# Patient Record
Sex: Female | Born: 1940 | Race: White | Hispanic: No | Smoking: Never smoker
Health system: Southern US, Community
[De-identification: ages and names within clinical notes are randomized; demographics above are authoritative.]

## PROBLEM LIST (undated history)

## (undated) DIAGNOSIS — Z7901 Long term (current) use of anticoagulants: Secondary | ICD-10-CM

## (undated) DIAGNOSIS — M25473 Effusion, unspecified ankle: Secondary | ICD-10-CM

## (undated) DIAGNOSIS — M179 Osteoarthritis of knee, unspecified: Secondary | ICD-10-CM

## (undated) DIAGNOSIS — G4730 Sleep apnea, unspecified: Secondary | ICD-10-CM

## (undated) DIAGNOSIS — H3530 Unspecified macular degeneration: Secondary | ICD-10-CM

## (undated) DIAGNOSIS — M1990 Unspecified osteoarthritis, unspecified site: Secondary | ICD-10-CM

## (undated) DIAGNOSIS — F419 Anxiety disorder, unspecified: Secondary | ICD-10-CM

## (undated) DIAGNOSIS — N201 Calculus of ureter: Secondary | ICD-10-CM

## (undated) DIAGNOSIS — Z87442 Personal history of urinary calculi: Secondary | ICD-10-CM

## (undated) DIAGNOSIS — G4733 Obstructive sleep apnea (adult) (pediatric): Secondary | ICD-10-CM

## (undated) DIAGNOSIS — I10 Essential (primary) hypertension: Secondary | ICD-10-CM

## (undated) DIAGNOSIS — I4891 Unspecified atrial fibrillation: Secondary | ICD-10-CM

## (undated) DIAGNOSIS — M1710 Unilateral primary osteoarthritis, unspecified knee: Secondary | ICD-10-CM

## (undated) DIAGNOSIS — E669 Obesity, unspecified: Secondary | ICD-10-CM

## (undated) HISTORY — DX: Obstructive sleep apnea (adult) (pediatric): G47.33

## (undated) HISTORY — DX: Obesity, unspecified: E66.9

## (undated) HISTORY — DX: Unspecified atrial fibrillation: I48.91

## (undated) HISTORY — PX: CARDIOVERSION: SHX1299

## (undated) HISTORY — DX: Anxiety disorder, unspecified: F41.9

---

## 1997-11-01 ENCOUNTER — Ambulatory Visit (HOSPITAL_COMMUNITY): Admission: RE | Admit: 1997-11-01 | Discharge: 1997-11-01 | Payer: Self-pay | Admitting: Obstetrics and Gynecology

## 1998-12-26 ENCOUNTER — Ambulatory Visit (HOSPITAL_COMMUNITY): Admission: RE | Admit: 1998-12-26 | Discharge: 1998-12-26 | Payer: Self-pay | Admitting: Obstetrics and Gynecology

## 1998-12-26 ENCOUNTER — Encounter: Payer: Self-pay | Admitting: Obstetrics and Gynecology

## 1999-12-28 ENCOUNTER — Encounter: Payer: Self-pay | Admitting: Obstetrics and Gynecology

## 1999-12-28 ENCOUNTER — Ambulatory Visit (HOSPITAL_COMMUNITY): Admission: RE | Admit: 1999-12-28 | Discharge: 1999-12-28 | Payer: Self-pay | Admitting: Obstetrics and Gynecology

## 2000-06-13 ENCOUNTER — Other Ambulatory Visit: Admission: RE | Admit: 2000-06-13 | Discharge: 2000-06-13 | Payer: Self-pay | Admitting: Obstetrics and Gynecology

## 2000-06-13 ENCOUNTER — Encounter (INDEPENDENT_AMBULATORY_CARE_PROVIDER_SITE_OTHER): Payer: Self-pay

## 2000-07-11 ENCOUNTER — Encounter (INDEPENDENT_AMBULATORY_CARE_PROVIDER_SITE_OTHER): Payer: Self-pay | Admitting: Specialist

## 2000-07-11 ENCOUNTER — Ambulatory Visit (HOSPITAL_COMMUNITY): Admission: RE | Admit: 2000-07-11 | Discharge: 2000-07-11 | Payer: Self-pay | Admitting: Obstetrics and Gynecology

## 2000-08-26 ENCOUNTER — Encounter: Payer: Self-pay | Admitting: Obstetrics and Gynecology

## 2000-08-29 ENCOUNTER — Encounter (INDEPENDENT_AMBULATORY_CARE_PROVIDER_SITE_OTHER): Payer: Self-pay | Admitting: Specialist

## 2000-08-29 ENCOUNTER — Ambulatory Visit (HOSPITAL_COMMUNITY): Admission: RE | Admit: 2000-08-29 | Discharge: 2000-08-29 | Payer: Self-pay | Admitting: Obstetrics and Gynecology

## 2000-08-29 HISTORY — PX: HYSTEROSCOPY WITH D & C: SHX1775

## 2000-11-12 ENCOUNTER — Encounter: Payer: Self-pay | Admitting: Obstetrics and Gynecology

## 2000-11-12 ENCOUNTER — Encounter (INDEPENDENT_AMBULATORY_CARE_PROVIDER_SITE_OTHER): Payer: Self-pay | Admitting: Specialist

## 2000-11-12 ENCOUNTER — Inpatient Hospital Stay (HOSPITAL_COMMUNITY): Admission: RE | Admit: 2000-11-12 | Discharge: 2000-11-14 | Payer: Self-pay | Admitting: Obstetrics and Gynecology

## 2000-11-12 HISTORY — PX: ABDOMINAL HYSTERECTOMY: SHX81

## 2001-04-01 ENCOUNTER — Ambulatory Visit (HOSPITAL_COMMUNITY): Admission: RE | Admit: 2001-04-01 | Discharge: 2001-04-01 | Payer: Self-pay | Admitting: Obstetrics and Gynecology

## 2001-04-01 ENCOUNTER — Encounter: Payer: Self-pay | Admitting: Obstetrics and Gynecology

## 2002-06-22 ENCOUNTER — Encounter: Payer: Self-pay | Admitting: Obstetrics and Gynecology

## 2002-06-22 ENCOUNTER — Ambulatory Visit (HOSPITAL_COMMUNITY): Admission: RE | Admit: 2002-06-22 | Discharge: 2002-06-22 | Payer: Self-pay | Admitting: Obstetrics and Gynecology

## 2002-10-16 ENCOUNTER — Emergency Department (HOSPITAL_COMMUNITY): Admission: EM | Admit: 2002-10-16 | Discharge: 2002-10-16 | Payer: Self-pay | Admitting: Emergency Medicine

## 2003-01-04 ENCOUNTER — Ambulatory Visit (HOSPITAL_COMMUNITY): Admission: RE | Admit: 2003-01-04 | Discharge: 2003-01-04 | Payer: Self-pay | Admitting: *Deleted

## 2003-08-18 ENCOUNTER — Ambulatory Visit (HOSPITAL_COMMUNITY): Admission: RE | Admit: 2003-08-18 | Discharge: 2003-08-18 | Payer: Self-pay | Admitting: Obstetrics and Gynecology

## 2004-09-21 ENCOUNTER — Ambulatory Visit (HOSPITAL_COMMUNITY): Admission: RE | Admit: 2004-09-21 | Discharge: 2004-09-21 | Payer: Self-pay | Admitting: Obstetrics and Gynecology

## 2005-10-10 ENCOUNTER — Ambulatory Visit (HOSPITAL_COMMUNITY): Admission: RE | Admit: 2005-10-10 | Discharge: 2005-10-10 | Payer: Self-pay | Admitting: Obstetrics and Gynecology

## 2006-11-04 ENCOUNTER — Ambulatory Visit (HOSPITAL_COMMUNITY): Admission: RE | Admit: 2006-11-04 | Discharge: 2006-11-04 | Payer: Self-pay | Admitting: Obstetrics and Gynecology

## 2007-12-01 ENCOUNTER — Ambulatory Visit (HOSPITAL_COMMUNITY): Admission: RE | Admit: 2007-12-01 | Discharge: 2007-12-01 | Payer: Self-pay | Admitting: Obstetrics and Gynecology

## 2008-11-29 ENCOUNTER — Ambulatory Visit (HOSPITAL_COMMUNITY): Admission: RE | Admit: 2008-11-29 | Discharge: 2008-11-29 | Payer: Self-pay | Admitting: Cardiology

## 2008-12-27 ENCOUNTER — Inpatient Hospital Stay (HOSPITAL_COMMUNITY): Admission: AD | Admit: 2008-12-27 | Discharge: 2008-12-29 | Payer: Self-pay | Admitting: Cardiology

## 2009-02-23 ENCOUNTER — Ambulatory Visit (HOSPITAL_COMMUNITY): Admission: RE | Admit: 2009-02-23 | Discharge: 2009-02-23 | Payer: Self-pay | Admitting: Obstetrics and Gynecology

## 2010-02-20 ENCOUNTER — Ambulatory Visit (HOSPITAL_COMMUNITY): Admission: RE | Admit: 2010-02-20 | Discharge: 2010-02-20 | Payer: Self-pay | Admitting: Cardiology

## 2010-03-15 ENCOUNTER — Ambulatory Visit (HOSPITAL_COMMUNITY): Admission: RE | Admit: 2010-03-15 | Discharge: 2010-03-15 | Payer: Self-pay | Admitting: Obstetrics and Gynecology

## 2010-04-17 ENCOUNTER — Encounter
Admission: RE | Admit: 2010-04-17 | Discharge: 2010-05-12 | Payer: Self-pay | Source: Home / Self Care | Attending: Specialist | Admitting: Specialist

## 2010-07-11 HISTORY — PX: DILATION AND CURETTAGE OF UTERUS: SHX78

## 2010-09-10 LAB — COMPREHENSIVE METABOLIC PANEL
ALT: 23 U/L (ref 0–35)
BUN: 18 mg/dL (ref 6–23)
CO2: 31 mEq/L (ref 19–32)
Calcium: 9.7 mg/dL (ref 8.4–10.5)
Chloride: 105 mEq/L (ref 96–112)
GFR calc Af Amer: >60 mL/min (ref >60)
GFR calc non Af Amer: >60 mL/min (ref >60)
Glucose, Bld: 110 mg/dL — ABNORMAL HIGH (ref 70–99)
Potassium: 4.6 mEq/L (ref 3.5–5.1)

## 2010-09-10 LAB — PROTIME-INR
INR: 2.1 — ABNORMAL HIGH (ref 0.00–1.49)
INR: 2.2 — ABNORMAL HIGH (ref 0.00–1.49)
Prothrombin Time: 24.9 seconds — ABNORMAL HIGH (ref 11.6–15.2)
Prothrombin Time: 26.6 seconds — ABNORMAL HIGH (ref 11.6–15.2)

## 2010-09-10 LAB — TSH: TSH: 1.02 u[IU]/mL (ref 0.350–4.500)

## 2010-09-10 LAB — CBC
MCHC: 34.6 g/dL (ref 30.0–36.0)
Platelets: 219 10*3/uL (ref 150–400)

## 2010-09-10 LAB — APTT: aPTT: 38 seconds — ABNORMAL HIGH (ref 24–37)

## 2010-10-17 NOTE — Discharge Summary (Signed)
[name], [name] NO.:  [id number]   MEDICAL RECORD NO.:  [id number]          PATIENT TYPE:  OIB   LOCATION:  2899                         FACILITY:  [facility]   PHYSICIAN:  Armanda Magic, M.D.     DATE OF BIRTH:  11-11-1940   DATE OF ADMISSION:  11/29/2008  DATE OF DISCHARGE:  11/29/2008                               DISCHARGE SUMMARY   ADMISSION DIAGNOSES:  1. Atrial fibrillation.  2. Systemic anticoagulation.  3. Hypertension.  4. Anxiety.  5. Osteoarthritis.   DISCHARGE DIAGNOSES:  1. Atrial fibrillation, status post amiodarone loading.  2. Hypertension.  3. Systemic anticoagulation.  4. Anxiety.  5. Osteoarthritis.   PROCEDURES:  None.   HISTORY OF PRESENT ILLNESS:  This is a pleasant 70 year old female who  has been in atrial fibrillation and underwent cardioversion on November 29, 2008, and has reverted back to atrial fibrillation.  She has been  therapeutic on her Coumadin for more than 4 weeks and is now admitted  for loading with amiodarone.   HOSPITAL COURSE:  The patient was admitted to the hospital and placed on  amiodarone 400 mg b.i.d.  She did undergo pulmonary function testing,  which was completely normal.  Her TSH, LFTs, CBC, and basic metabolic  panel were all within normal limits.  She maintained a therapeutic INR  throughout her hospital stay.  Chest x-ray revealed some mild chronic  bronchitic changes.  Her hospital course was uncomplicated.  Her QTc  remained within normal range for amiodarone loading, and her QTc on day  of discharge was 471 milliseconds, and EKG showed persistent atrial  fibrillation with controlled ventricular response.  She was subsequently  discharged to home on the 28th in stable condition.   LABORATORY DATA DURING HOSPITAL STAY:  INR was 2.2 on the 28th, TSH  1.020, sodium 141, potassium 4.6, chloride 105, CO2 31, glucose 110, BUN  18, and creatinine 0.66.  white cell count 6, hemoglobin 14.5,  hematocrit  41.8, and platelet count 219.   DISCHARGE MEDICATIONS:  1. Vitamin D 1000 international units daily.  2. Furosemide 40 mg daily.  3. Metformin HCL ER 500 mg daily.  4. Potassium chloride 20 mEq 1 tablet daily.  5. Celebrex 200 mg p.r.n.  6. Acetaminophen p.r.n.  7. ICaps multivitamin daily.  8. Fish oil 1000 mg 2 tablets twice a day.  9. Vitamin B complex daily.  10.Hyzaar 50/12.5 mg daily.  11.Metoprolol tartrate 50 mg b.i.d.  12.Warfarin 7.5 everyday except for 5 mg on Wednesday and Saturday.   OUTPATIENT FOLLOWUP:  The patient will follow up in our Coumadin Clinic  with Alfonse Ras, our pharmacologist, on December 31, 2008, at her  regularly scheduled appointment.  She will also follow up with me in the  office on January 26, 2009, at 10:00 a.m. for followup EKG and evaluation  to see if she is in atrial fibrillation.  If she has not converted on  her own to sinus rhythm, we will plan outpatient cardioversion at that  time.  She will also be following up in our clinic  in 1 week for an EKG  check.      Armanda Magic, M.D.  Electronically Signed     TT/MEDQ  D:  12/29/2008  T:  12/29/2008  Job:  [id number]   cc:   Molly Maduro L. Foy Guadalajara, M.D.

## 2010-10-17 NOTE — Op Note (Signed)
[name], [name] NO.:  [id number]   MEDICAL RECORD NO.:  [id number]          PATIENT TYPE:  OIB   LOCATION:  2899                         FACILITY:  [facility]   PHYSICIAN:  [name], M.D.  DATE OF BIRTH:  04/17/1941   DATE OF PROCEDURE:  11/29/2008  DATE OF DISCHARGE:  11/29/2008                               OPERATIVE REPORT   REFERRING PHYSICIAN:  Molly Maduro L. Foy Guadalajara, MD   PROCEDURE:  Direct current cardioversion.   OPERATOR:  Shelia Magic, MD   INDICATIONS:  Atrial fibrillation.   COMPLICATIONS:  None.   INTRAVENOUS MEDICATIONS:  Pentothal 200 mg IV.   This is a very pleasant 70 year old female who has a history of atrial  fibrillation in the past and presented with recurrent atrial  fibrillation.  She has been adequately anticoagulated with an INR of  greater than 2 for 4 weeks and now presents for cardioversion.   The patient was brought to the day hospital in a fasting and nonsedated  state.  Informed consent was obtained.  The patient was connected to  continuous heart rate and pulse oximetry monitoring and intermittent  blood pressure monitoring.  The defibrillator pads were placed on the  left anterior chest and back.  After adequate anesthesia was obtained, a  150 joules synchronized biphasic shock was delivered which successfully  converted the patient to sinus rhythm.  The patient tolerated the  procedure well without complications.  The patient subsequently was  discharged to home after fully awake and ambulating.   ASSESSMENT:  1. Atrial fibrillation.  2. Systemic anticoagulation with therapeutic INR.  3. Status post successful cardioversion to sinus rhythm.   PLAN:  Discharge home when fully awake and ambulating and follow up with  me in 2 weeks.      [name], M.D.  Electronically Signed     ______________________________  [name], M.D.    TT/MEDQ  D:  11/29/2008  T:  11/30/2008  Job:  [id number]   cc:    [name], M.D.

## 2010-10-17 NOTE — Discharge Summary (Signed)
NAMEKIRSTEIN, BAXLEY NO.:  [id number]   MEDICAL RECORD NO.:  [id number]          PATIENT TYPE:  INP   LOCATION:  2007                         FACILITY:  [facility]   PHYSICIAN:  Armanda Magic, M.D.     DATE OF BIRTH:  08-21-1940   DATE OF ADMISSION:  12/27/2008  DATE OF DISCHARGE:  12/29/2008                               DISCHARGE SUMMARY   ADDENDUM   The patient is also discharged home on amiodarone 200 mg.  She will take  2 tablets twice daily for 2 weeks, then 2 tablets once daily for 1 week,  and then 1 tablet daily.      Armanda Magic, M.D.  Electronically Signed     Armanda Magic, M.D.  Electronically Signed    TT/MEDQ  D:  12/29/2008  T:  12/29/2008  Job:  [id number]

## 2010-10-20 NOTE — Consult Note (Signed)
[facility]  Patient:    [name], [name]                        MRN: [id number] Adm. Date:  [id number] Attending:  Lendon Colonel CC:         Dr. Foy Guadalajara.  Kyra Manges, M.D.   Consultation Report  CARDIOLOGY CONSULT  CHIEF COMPLAINT:              Supraventricular tachycardia.  HISTORY OF PRESENT ILLNESS:   This is 70 year old with no previous history but a history of hypertension, no cardiac symptoms, who was admitted for a D&C and uterine fibroid resection. During her surgery, she had an episode of SVT wit heart rates up to 140-150 beats per minute with a blood pressure of 145/75. SVT was terminated after a total of 18 mg worth of Adenocard in the recovery room. Currently, she is asymptomatic. She has no history of palpitations.  PAST MEDICAL HISTORY:         Significant for hypertension.  PAST SURGICAL HISTORY:        None.  ALLERGIES:                    PENICILLIN.  CURRENT MEDICATIONS:          Activella, BuSpar and Tenoretic.  SOCIAL HISTORY:               She is married with two daughters. N tobacco use, alcohol use.  FAMILY HISTORY:               Father is 52 with colon cancer, diabetes. He is status post myocardial infarction in his 21s. Her mother is 37 years old with Alzheimers, hypertension, and questionable heart disease in past. She has one brother who died at 1 or aortic dissection. She has two brothers and three sisters who are healthy with no coronary disease.  PHYSICAL EXAMINATION:  VITAL SIGNS:                  Blood pressure is 140/65. Heart rate is 60-70.  GENERAL:                      This is a well-developed, obese white female in no acute distress.  HEENT:                        Pupils are equal, round, reactive to light and accommodation. Extraocular muscles intact bilaterally.  NECK:                         Supple without lymphadenopathy. No bruits.  LUNGS:                        Clear to auscultation  throughout.  HEART:                        Regular rate and rhythm.  No murmurs, rubs, or gallops. Normal S1, S2.  ABDOMEN:                      Soft, nontender, nondistended with active bowel sounds. No hepatosplenomegaly.  EXTREMITIES:                  No cyanosis, erythema or edema. Good distal pulses.  LABORATORIES:  Potassium 2.9 on February 5. CPK 133. MB 3.5. Troponin 0.01.  EKG shows normal sinus rhythm with nonspecific ST-T wave abnormalities.  ASSESSMENT:                   Supraventricular tachycardia terminated with                               Adenocard. There is no history of coronary                               disease or chest pain but she does have several                               cardiac risk factors including obesity,                               family history of coronary disease at an early                               age, history of hyperlipidemia with a total                               cholesterol recently of 234.  RECOMMENDATIONS:              1. Recheck potassium. If still less than 3.5,                                 replete before discharge.                               2. Cardizem CD 180 mg a day.                               3. Outpatient Adenosine Cardiolite in our                                  office.                               4. Follow up with Dr. Mayford Knife in one week. DD:  07/11/00 TD:  07/11/00 Job: [id number]

## 2010-10-20 NOTE — Op Note (Signed)
[facility]  Patient:    [name], [name]                        MRN: [id number] Proc. Date: 08/29/00 Adm. Date:  [id number] Attending:  Lendon Colonel                           Operative Report  PREOPERATIVE DIAGNOSES:  Persistent postmenopausal vaginal bleeding, uterine fibroids, and multiple uterine polyps.  POSTOPERATIVE DIAGNOSES:  Persistent postmenopausal vaginal bleeding, uterine fibroids, and multiple uterine polyps.  OPERATION PERFORMED:  Hysteroscopy with resection of multiple polyps and myomas.  DESCRIPTION OF PROCEDURE:  The patient was placed in lithotomy position, prepped and draped in the usual fashion. The bladder emptied, cervix progressively dilated. The cervix was injected with 20 cc of a Pitressin solution containing 10 units of Pitressin and 250 cc of saline and then the hysteroscopy was accomplished. There was a large posterior uterine myoma which was resected and multiple anterior uterine polyps that were resected. The surface of the endometrial cavity was then cauterized to reduce blood loss. All of the resected material was sent to the lab for study. The uterus was quite large. No unusual blood loss occurred. Her cardiac rate and rhythm were perfectly acceptable during the procedure. She was taken to the recovery room in good condition. DD:  08/29/00 TD:  08/29/00 Job: [id number]

## 2010-10-20 NOTE — H&P (Signed)
[facility]  Patient:    [name], [name]                        MRN: [id number] Adm. Date:  [id number] Disc. Date: [id number] Attending:  Lendon Colonel                         History and Physical  CHIEF COMPLAINT:  Continued abnormal uterine bleeding.  HISTORY OF PRESENT ILLNESS:  [age]-year-old para 2 female who is postmenopausal, on hormone replacement therapy, with abnormal uterine bleeding.  She has continued to spot despite Lo/Ovral therapy and numerous other estrogen preparations.  At the time of hysteroscopy, she had a large uterine fibroid, multiple polyps and myomas.  Because of the continued bleeding, a hysterectomy is recommended.  PAST MEDICAL HISTORY:  Significant in that she is hypertensive.  MEDICATIONS:  She is on Cardizem and Lo/Ovral.  ALLERGIES:  She is allergic to PENICILLIN.  PAST SURGICAL HISTORY:  She has had two hysteroscopies.  With her first hysteroscopy, she had SVT.  She was cleared by the cardiologist, Dr. Armanda Magic, after finding a normal stress test.  REVIEW OF SYSTEMS:  HEENT:  She wears glasses but no decrease in visual or auditory acuity.  HEART:  She has had recent cardiac workup, on Cardizem for the SVT, but denies chest pain, denies shortness of breath.  Her hypertension is well-controlled with atenolol and chlorothiazide.  LUNGS:  No history of cough.  No history of asthma.  GU:  No stress incontinence of frequency.  No history of UTI.  GI:  No bowel habit change.  No melena.  No history of ulcers.  MUSCLE, BONES AND JOINTS:  She has a history of arthritis for which she takes occasional ibuprofen.  SOCIAL HISTORY:  She works for [employer].  Does not smoke or drink.  FAMILY HISTORY:  Her mother is 31, has hypertension and Alzheimers.  Father is 72 and has cancer of the colon and heart disease.  She has had three myocardial infarctions and is diabetic.  PHYSICAL EXAMINATION:  VITAL  SIGNS:  Examination reveals a weight of 224 pounds, a blood pressure of 140/80.  HEENT:   Examination of the eyes, ears, nose and throat is unremarkable. Oropharynx is not injected.  The pupils are round and regular and react to light and accommodation.  NECK:  Supple.  Thyroid is not enlarged.  Carotid pulses are equal without bruits.  No adenopathy appreciated.  Trachea is in the midline.  BREASTS:  No masses or tenderness.  HEART:  Normal sinus rhythm.  No murmurs.  LUNGS:  Clear.  ABDOMEN:  Soft, obese.  Liver, spleen and kidneys are not palpated.  Bowel sounds are normal.  No bruits are heard.  PELVIC:  Vulva and vagina are normal.  The cervix is clean.  Uterus is enlarged to about three times normal size with no adnexal masses noted.  RECTAL:  Rectovaginal confirms.  Hemoccult is negative.  EXTREMITIES:  Good range of motion, equal pulses and reflexes.  NEUROLOGIC:  Patient is alert, oriented to time, place and recent events and cranial nerves are intact.  IMPRESSION:  Large uterine fibroids with continued postmenopausal bleeding.  PLAN:  TAH/BSO, risks and benefits discussed with patient. DD:  11/12/00 TD:  11/12/00 Job: [id number]

## 2010-10-20 NOTE — Discharge Summary (Signed)
[facility]  Patient:    [name], [name]                     MRN: [id number] Adm. Date:  [id number] Disc. Date: [id number] Attending:  Lendon Colonel CC:         Armanda Magic, M.D.   Discharge Summary  ADMISSION DIAGNOSES: 1. Continued postmenopausal bleeding. 2. Uterine fibroids.  DISCHARGE DIAGNOSES: 1. Continued postmenopausal bleeding. 2. Uterine fibroids.  OPERATIONS:  Total abdominal hysterectomy and bilateral salpingo-oophorectomy.  HISTORY OF PRESENT ILLNESS:  [name] is a 70 year old female, status post hysteroscopy for resection of multiple fibroids, who continued to have heavy bleeding and was admitted for surgery.  PAST MEDICAL HISTORY:  ASVT which was treated successfully with Cardizem. Cardiac evaluation by Dr. Mayford Knife has been within normal limits.  LABORATORY DATA:  Hemoglobin 13, mean corpuscular volume was 88.  Metabolic profile was normal.  Chest x-ray and abdomen x-ray were negative.  HOSPITAL COURSE:  The patient was admitted to the hospital, underwent an uneventful total abdominal hysterectomy and bilateral salpingo-oophorectomy with the findings of a large cervical fibroid, which was benign.  Both ovaries were benign.  Her postoperative course was uncomplicated.  She remained afebrile and without complaints.  She had no cardiac arrhythmias, and was discharged with her preoperative medications, including hormone replacement therapy.  She will call for fever or bleeding, and come to the office in two weeks.  CONDITION ON DISCHARGE:  Improved. DD:  11/20/00 TD:  11/20/00 Job: [id number]

## 2010-10-20 NOTE — Op Note (Signed)
[facility]  Patient:    [name], [name]                        MRN: [id number] Proc. Date: 11/12/00 Adm. Date:  [id number] Disc. Date: [id number] Attending:  Lendon Colonel                           Operative Report  PREOPERATIVE DIAGNOSES:  Large uterine fibroids, persistent postmenopausal bleeding.  POSTOPERATIVE DIAGNOSES:  Large uterine fibroids, persistent postmenopausal bleeding.  OPERATION:  Total abdominal hysterectomy, bilateral salpingo-oophorectomy, excision of large cervical fibroid.  DESCRIPTION OF PROCEDURE:  The patient was placed in lithotomy position and prepped and draped in the usual fashion.  The abdomen was entered through a transverse approach.  Exploration of the upper abdomen revealed a normal liver, gallbladder.  The gallbladder appeared to have a little sand in it. Both kidneys were normal.  There was no free peritoneal fluid, and I felt no intraabdominal masses.  There was a marked amount of fatty tissue in the omentum.  The abdominal viscera were then carefully packed away from the pelvic viscera.  The uterus was enlarged.  Both ovaries appeared to be normal.  The infundibulopelvic ligaments were skeletonized and ligated with 0 chromic sutures x 2.  On each side, the round ligaments were severed.  Uterine vessels were then skeletonized.  At that time, it was noticed that there was about a 3 cm large cervical fibroid and using careful bites down the side of the cervix into the paracervical tissue, we were able to complete the hysterectomy, removing the uterus, ovaries, and cervix intact.  The angles of the vagina were transfixed with a horizontal mattress suture of 0 chromic and then the vagina was closed with figure-of-eight sutures of 2-0 Vicryl.  The uterosacral ligaments were plicated in the midline quite carefully to effect good vault support, and the peritoneum was closed with 3-0 Vicryl.  Hemostasis was secure.   The parietal peritoneum was closed with 2-0 PDS.  The fascia was closed with 2-0 PDS and interrupted sutures of 0 Vicryl.  Following this, I was able to put one suture of 3-0 Vicryl in the subcutaneum and close the skin with 4-0 PDS subcuticular suture.  Dry, sterile dressing was applied.  Then 20 cc of 0.5% Marcaine with epinephrine was injected into the incision.  Ms. Barna tolerated this procedure well and was sent to the recovery room in good condition. DD:  11/12/00 TD:  11/12/00 Job: [id number]

## 2010-10-20 NOTE — Op Note (Signed)
[facility]  Patient:    [name], [name]                        MRN: [id number] Proc. Date: 07/11/00 Adm. Date:  [id number] Disc. Date: [id number] Attending:  Lendon Colonel                           Operative Report  PREOPERATIVE DIAGNOSES:       1. Postmenopausal vaginal bleeding.                               2. Uterine fibroids.  POSTOPERATIVE DIAGNOSES:      1. Postmenopausal vaginal bleeding.                               2. Uterine fibroids.  OPERATION:                    Dilation and curettage, hysteroscopy abandoned because of tachycardia.  DESCRIPTION OF PROCEDURE:     The patient was placed in lithotomy position and prepped and draped in the usual fashion.  The cervix dilated.  While I was dilating the cervix, tachycardia occurred, and it did not respond to measures by the anesthesiologist, so a rapid curettage was performed with removal of endometrial tissue, and the procedure was abandoned to take the patient to the recovery room. DD:  07/11/00 TD:  07/13/00 Job: [id number]

## 2011-03-13 ENCOUNTER — Ambulatory Visit (HOSPITAL_COMMUNITY)
Admission: RE | Admit: 2011-03-13 | Discharge: 2011-03-13 | Disposition: A | Discharge status: Home / Self Care | Payer: Medicare Other | Source: Ambulatory Visit | Attending: Cardiology | Admitting: Cardiology

## 2011-03-13 DIAGNOSIS — I4891 Unspecified atrial fibrillation: Secondary | ICD-10-CM | POA: Insufficient documentation

## 2011-03-13 DIAGNOSIS — Z79899 Other long term (current) drug therapy: Secondary | ICD-10-CM | POA: Insufficient documentation

## 2011-03-28 ENCOUNTER — Other Ambulatory Visit (HOSPITAL_COMMUNITY): Payer: Self-pay | Admitting: Urology

## 2011-03-28 DIAGNOSIS — N281 Cyst of kidney, acquired: Secondary | ICD-10-CM

## 2011-03-30 ENCOUNTER — Ambulatory Visit (HOSPITAL_COMMUNITY)
Admission: RE | Admit: 2011-03-30 | Discharge: 2011-03-30 | Disposition: A | Discharge status: Home / Self Care | Payer: Medicare Other | Source: Ambulatory Visit | Attending: Urology | Admitting: Urology

## 2011-03-30 DIAGNOSIS — K8020 Calculus of gallbladder without cholecystitis without obstruction: Secondary | ICD-10-CM | POA: Insufficient documentation

## 2011-03-30 DIAGNOSIS — Q619 Cystic kidney disease, unspecified: Secondary | ICD-10-CM | POA: Insufficient documentation

## 2011-03-30 DIAGNOSIS — K7689 Other specified diseases of liver: Secondary | ICD-10-CM | POA: Insufficient documentation

## 2011-03-30 DIAGNOSIS — N281 Cyst of kidney, acquired: Secondary | ICD-10-CM

## 2011-03-30 MED ORDER — GADOBENATE DIMEGLUMINE 529 MG/ML IV SOLN
20.0000 mL | Freq: Once | INTRAVENOUS | Status: AC | PRN
  Administered 2011-03-30: 20 mL via INTRAVENOUS

## 2011-04-16 ENCOUNTER — Other Ambulatory Visit: Payer: Self-pay | Admitting: Urology

## 2011-05-02 ENCOUNTER — Other Ambulatory Visit (HOSPITAL_COMMUNITY): Payer: Self-pay | Admitting: Family Medicine

## 2011-05-02 ENCOUNTER — Encounter (HOSPITAL_BASED_OUTPATIENT_CLINIC_OR_DEPARTMENT_OTHER): Payer: Self-pay | Admitting: *Deleted

## 2011-05-02 DIAGNOSIS — Z1231 Encounter for screening mammogram for malignant neoplasm of breast: Secondary | ICD-10-CM

## 2011-05-02 NOTE — Progress Notes (Signed)
NPO AFTER MN. PT ARRIVES AT 0945. NEEDS PT/INR, BMET, HG, KUB, AND EKG. WILL TAKE METOPROLOL AND AMIODORONE AM OF SURG. W/ SIP OF WATER. LAST NOTE, EKG, ECHO AND STRESS TEST TO BE FAXED FROM DR TRACI TURNER.

## 2011-05-04 ENCOUNTER — Ambulatory Visit (HOSPITAL_BASED_OUTPATIENT_CLINIC_OR_DEPARTMENT_OTHER)
Admission: RE | Admit: 2011-05-04 | Discharge: 2011-05-04 | Disposition: A | Discharge status: Home / Self Care | Payer: Medicare Other | Source: Ambulatory Visit | Attending: Urology | Admitting: Urology

## 2011-05-04 ENCOUNTER — Encounter (HOSPITAL_BASED_OUTPATIENT_CLINIC_OR_DEPARTMENT_OTHER)
Admission: RE | Disposition: A | Discharge status: Home / Self Care | Payer: Self-pay | Source: Ambulatory Visit | Attending: Urology

## 2011-05-04 ENCOUNTER — Encounter (HOSPITAL_BASED_OUTPATIENT_CLINIC_OR_DEPARTMENT_OTHER): Payer: Self-pay | Admitting: Anesthesiology

## 2011-05-04 ENCOUNTER — Other Ambulatory Visit: Payer: Self-pay

## 2011-05-04 ENCOUNTER — Ambulatory Visit (HOSPITAL_COMMUNITY): Payer: Medicare Other

## 2011-05-04 ENCOUNTER — Ambulatory Visit (HOSPITAL_BASED_OUTPATIENT_CLINIC_OR_DEPARTMENT_OTHER): Payer: Medicare Other | Admitting: Anesthesiology

## 2011-05-04 ENCOUNTER — Encounter (HOSPITAL_BASED_OUTPATIENT_CLINIC_OR_DEPARTMENT_OTHER): Payer: Self-pay | Admitting: *Deleted

## 2011-05-04 DIAGNOSIS — R3129 Other microscopic hematuria: Secondary | ICD-10-CM | POA: Insufficient documentation

## 2011-05-04 DIAGNOSIS — E119 Type 2 diabetes mellitus without complications: Secondary | ICD-10-CM | POA: Insufficient documentation

## 2011-05-04 DIAGNOSIS — Z79899 Other long term (current) drug therapy: Secondary | ICD-10-CM | POA: Insufficient documentation

## 2011-05-04 DIAGNOSIS — Z7901 Long term (current) use of anticoagulants: Secondary | ICD-10-CM | POA: Insufficient documentation

## 2011-05-04 DIAGNOSIS — I4891 Unspecified atrial fibrillation: Secondary | ICD-10-CM | POA: Insufficient documentation

## 2011-05-04 DIAGNOSIS — N201 Calculus of ureter: Secondary | ICD-10-CM | POA: Insufficient documentation

## 2011-05-04 DIAGNOSIS — N35919 Unspecified urethral stricture, male, unspecified site: Secondary | ICD-10-CM | POA: Insufficient documentation

## 2011-05-04 DIAGNOSIS — I10 Essential (primary) hypertension: Secondary | ICD-10-CM | POA: Insufficient documentation

## 2011-05-04 DIAGNOSIS — N1330 Unspecified hydronephrosis: Secondary | ICD-10-CM | POA: Insufficient documentation

## 2011-05-04 HISTORY — DX: Unilateral primary osteoarthritis, unspecified knee: M17.10

## 2011-05-04 HISTORY — DX: Calculus of ureter: N20.1

## 2011-05-04 HISTORY — DX: Unspecified osteoarthritis, unspecified site: M19.90

## 2011-05-04 HISTORY — DX: Effusion, unspecified ankle: M25.473

## 2011-05-04 HISTORY — DX: Essential (primary) hypertension: I10

## 2011-05-04 HISTORY — DX: Unspecified macular degeneration: H35.30

## 2011-05-04 HISTORY — DX: Osteoarthritis of knee, unspecified: M17.9

## 2011-05-04 HISTORY — PX: CYSTOSCOPY/RETROGRADE/URETEROSCOPY: SHX5316

## 2011-05-04 HISTORY — DX: Long term (current) use of anticoagulants: Z79.01

## 2011-05-04 HISTORY — PX: CYSTOSCOPY WITH URETHRAL DILATATION: SHX5125

## 2011-05-04 LAB — PROTIME-INR
INR: 1.12 (ref 0.00–1.49)
Prothrombin Time: 14.6 seconds (ref 11.6–15.2)

## 2011-05-04 LAB — BASIC METABOLIC PANEL
Chloride: 98 mEq/L (ref 96–112)
Creatinine, Ser: 0.63 mg/dL (ref 0.50–1.10)
GFR calc Af Amer: >90 mL/min (ref >90)
Potassium: 3.7 mEq/L (ref 3.5–5.1)
Sodium: 135 mEq/L (ref 135–145)

## 2011-05-04 LAB — HEMOGLOBIN AND HEMATOCRIT, BLOOD: Hemoglobin: 13 g/dL (ref 12.0–15.0)

## 2011-05-04 SURGERY — CYSTOSCOPY/RETROGRADE/URETEROSCOPY
Anesthesia: General | Site: Urethra | Laterality: Left | Wound class: Clean Contaminated

## 2011-05-04 MED ORDER — HYDROCODONE-ACETAMINOPHEN 5-500 MG PO TABS: 1.0000 | ORAL_TABLET | Freq: Four times a day (QID) | ORAL | Status: AC | PRN

## 2011-05-04 MED ORDER — IOHEXOL 350 MG/ML SOLN
INTRAVENOUS | Status: DC | PRN
  Administered 2011-05-04: 50 mL

## 2011-05-04 MED ORDER — KETOROLAC TROMETHAMINE 30 MG/ML IJ SOLN: 15.0000 mg | Freq: Once | INTRAMUSCULAR | Status: DC | PRN

## 2011-05-04 MED ORDER — SODIUM CHLORIDE 0.9 % IR SOLN
Status: DC | PRN
  Administered 2011-05-04: 3000 mL

## 2011-05-04 MED ORDER — FENTANYL CITRATE 0.05 MG/ML IJ SOLN: 25.0000 ug | INTRAMUSCULAR | Status: DC | PRN

## 2011-05-04 MED ORDER — LACTATED RINGERS IV SOLN
INTRAVENOUS | Status: DC
  Administered 2011-05-04 (×2): via INTRAVENOUS

## 2011-05-04 MED ORDER — LIDOCAINE HCL (CARDIAC) 20 MG/ML IV SOLN
INTRAVENOUS | Status: DC | PRN
  Administered 2011-05-04: 80 mg via INTRAVENOUS
  Administered 2011-05-04: 20 mg via INTRAVENOUS

## 2011-05-04 MED ORDER — ONDANSETRON HCL 4 MG/2ML IJ SOLN
INTRAMUSCULAR | Status: DC | PRN
  Administered 2011-05-04: 4 mg via INTRAVENOUS

## 2011-05-04 MED ORDER — PROPOFOL 10 MG/ML IV EMUL
INTRAVENOUS | Status: DC | PRN
  Administered 2011-05-04: 50 mg via INTRAVENOUS
  Administered 2011-05-04: 200 mg via INTRAVENOUS

## 2011-05-04 MED ORDER — CIPROFLOXACIN IN D5W 400 MG/200ML IV SOLN
400.0000 mg | Freq: Two times a day (BID) | INTRAVENOUS | Status: DC
  Administered 2011-05-04: 400 mg via INTRAVENOUS

## 2011-05-04 MED ORDER — FENTANYL CITRATE 0.05 MG/ML IJ SOLN
INTRAMUSCULAR | Status: DC | PRN
  Administered 2011-05-04: 50 ug via INTRAVENOUS
  Administered 2011-05-04 (×2): 25 ug via INTRAVENOUS

## 2011-05-04 MED ORDER — CEFAZOLIN SODIUM 1-5 GM-% IV SOLN: 1.0000 g | INTRAVENOUS | Status: DC

## 2011-05-04 SURGICAL SUPPLY — 21 items
ADAPTER CATH URET PLST 4-6FR (CATHETERS) ×3 IMPLANT
BAG DRAIN URO-CYSTO SKYTR STRL (DRAIN) ×3 IMPLANT
BASKET ZERO TIP NITINOL 2.4FR (BASKET) ×3 IMPLANT
CANISTER SUCT LVC 12 LTR MEDI- (MISCELLANEOUS) ×3 IMPLANT
CATH INTERMIT  6FR 70CM (CATHETERS) ×3 IMPLANT
CATH URET 5FR 28IN CONE TIP (BALLOONS) ×1
CATH URET 5FR 70CM CONE TIP (BALLOONS) ×2 IMPLANT
CLOTH BEACON ORANGE TIMEOUT ST (SAFETY) ×3 IMPLANT
DRAPE CAMERA CLOSED 9X96 (DRAPES) ×3 IMPLANT
GLOVE BIO SURGEON STRL SZ 6.5 (GLOVE) ×3 IMPLANT
GLOVE BIO SURGEON STRL SZ7 (GLOVE) ×3 IMPLANT
GLOVE ECLIPSE 6.0 STRL STRAW (GLOVE) ×3 IMPLANT
GOWN PREVENTION PLUS LG XLONG (DISPOSABLE) ×6 IMPLANT
GOWN XL W/COTTON TOWEL STD (GOWNS) ×6 IMPLANT
GUIDEWIRE ANG ZIPWIRE 038X150 (WIRE) ×3 IMPLANT
GUIDEWIRE STR DUAL SENSOR (WIRE) ×3 IMPLANT
IV NS IRRIG 3000ML ARTHROMATIC (IV SOLUTION) ×6 IMPLANT
LASER FIBER DISP (UROLOGICAL SUPPLIES) ×3 IMPLANT
PACK CYSTOSCOPY (CUSTOM PROCEDURE TRAY) ×3 IMPLANT
STENT URET 6FRX24 CONTOUR (STENTS) ×3 IMPLANT
WATER STERILE IRR 500ML POUR (IV SOLUTION) ×3 IMPLANT

## 2011-05-04 NOTE — Anesthesia Postprocedure Evaluation (Signed)
  Anesthesia Post-op Note  Patient: [name]  Procedure(s) Performed:  CYSTOSCOPY/RETROGRADE/URETEROSCOPY - CYSTOSCOPY, RETROGRADE URETEROSCOPY, HOLMIUM LASER, JJ STENT, Basket stone extraction ; CYSTOSCOPY WITH URETHRAL DILATATION  Patient Location: PACU  Anesthesia Type: General  Level of Consciousness: awake and alert   Airway and Oxygen Therapy: Patient Spontanous Breathing  Post-op Pain: mild  Post-op Assessment: Post-op Vital signs reviewed, Patient's Cardiovascular Status Stable, Respiratory Function Stable, Patent Airway and No signs of Nausea or vomiting  Post-op Vital Signs: stable  Complications: No apparent anesthesia complications

## 2011-05-04 NOTE — Transfer of Care (Signed)
Immediate Anesthesia Transfer of Care Note  Patient: [name]  Procedure(s) Performed:  CYSTOSCOPY/RETROGRADE/URETEROSCOPY - CYSTOSCOPY, RETROGRADE URETEROSCOPY, HOLMIUM LASER, JJ STENT, Basket stone extraction ; CYSTOSCOPY WITH URETHRAL DILATATION  Patient Location: PACU  Anesthesia Type: General  Level of Consciousness: awake, alert  and oriented  Airway & Oxygen Therapy: Patient Spontanous Breathing and Patient connected to face mask oxygen  Post-op Assessment: Report given to PACU RN and Post -op Vital signs reviewed and stable  Post vital signs: Reviewed and stable  Complications: No apparent anesthesia complications

## 2011-05-04 NOTE — Anesthesia Preprocedure Evaluation (Addendum)
Anesthesia Evaluation  Patient identified by MRN, date of birth, ID band Patient awake    Reviewed: Allergy & Precautions, H&P , NPO status , Patient's Chart, lab work & pertinent test results  Airway Mallampati: II TM Distance: <3 FB Neck ROM: Full    Dental No notable dental hx.    Pulmonary neg pulmonary ROS,  clear to auscultation  Pulmonary exam normal       Cardiovascular hypertension, Pt. on medications neg cardio ROS Irregular Normal A fib   Neuro/Psych Negative Neurological ROS  Negative Psych ROS   GI/Hepatic negative GI ROS, Neg liver ROS,   Endo/Other  Negative Endocrine ROSDiabetes mellitus-Morbid obesity  Renal/GU negative Renal ROS  Genitourinary negative   Musculoskeletal negative musculoskeletal ROS (+) Arthritis -,   Abdominal   Peds negative pediatric ROS (+)  Hematology negative hematology ROS (+)   Anesthesia Other Findings   Reproductive/Obstetrics negative OB ROS                           Anesthesia Physical Anesthesia Plan  ASA: III  Anesthesia Plan: General   Post-op Pain Management:    Induction:   Airway Management Planned: LMA  Additional Equipment:   Intra-op Plan:   Post-operative Plan:   Informed Consent: I have reviewed the patients History and Physical, chart, labs and discussed the procedure including the risks, benefits and alternatives for the proposed anesthesia with the patient or authorized representative who has indicated his/her understanding and acceptance.   Dental advisory given  Plan Discussed with: CRNA  Anesthesia Plan Comments:         Anesthesia Quick Evaluation

## 2011-05-04 NOTE — Op Note (Signed)
[name] is a 70 y.o.   05/04/2011  Anesthesia: General  Surgeon: Wendie Simmer. Peniel Hass  Procedure : Cystoscopy, meatal dilation,  left retrograde pyelogram, ureteroscopy, holmium laser , JJ stent.  Indication: Patient is a 70 years old female who was initially seen for microhematuria about six weeks ago. A CT scan showed a 9 x 12 mm stone in the left distal ureter with moderate hydronephrosis. She has been having pain on and off and in fact she experienced pain this morning. KUB showed the stone in the same location. She is scheduled for cystoscopy, retrograde pyelogram, ureteroscopy, holmium laser of ureteral calculus and double-J stent.  Procedure: The patient was identified by her wrist band and proper timeout was taken.   A panendoscope was passed in with some difficulty in the bladder because of meatal stenosis. I dilated the urethra up to #30 [place].  The bladder mucosa is normal there is no stone or tumor in the bladder.  The ureteral orifices are in normal position and shape.  Retrograde pyelogram: A cone-tip catheter was passed through the cystoscope and through the left ureteral orifice. Contrast was then injected through the cone-tip catheter. A small amount of contrast was seen in the distal ureter but contrast could not fill the distal or the proximal ureter. The cone-tip catheter was then removed.  A sensor wire was then passed through the cystoscope and the left ureteral orifice. The sensor wire could not be passed into the distal ureter because of the stone.  The sensor wire was then removed.  A Glidewire was passed through a #6 [place] open ended catheter and the Glidewire was advanced all the way up to the renal pelvis.  The cystoscope was then removed.   The Glidewire was left in place as a safety wire. A semirigid ureteroscope was then passed in the bladder and through the left ureteral orifice. The stone was visualized in the distal ureter and measures about 12 x 9 mm.   With a  365 microfiberl holmium laser the stone was fragmented in multiple small stone fragments.  The stone fragments were then extracted from the ureter and then dropped in the bladder.  There was no evidence of remaining stone fragment in the distal ureter.  The ureteroscope was then advanced all the way up into the proximal ureter without any evidence of remaining stone fragments. All stone fragments were then irrigated out of the bladder.  The ureteroscope was then removed.  The glidewire was then backloaded into the cystoscope. And a 6 French-24 double-J stent was passed over the glidewire. The glidewire was then removed.   The proximal curl of the double-J stent is in the renal pelvis and the distal curl is in the bladder period  The bladder was then emptied and the cystoscope was removed.  The patient tolerated the procedure well and left the OR in satisfactory condition to postanesthesia care unit.

## 2011-05-04 NOTE — H&P (Signed)
History and Physical  Chief Complaint: Left lower quadrant pain.  Microhematuria.  History of Present Illness: The patient is a 70 years old female who was initially seen for microhematuria.  CT scan revealed a 9 x 12 mm left distal ureteral calculus.  She has left lower quadrant and experienced some pain this morning.  She is here today for stone manipulation.  Past Medical History  Diagnosis Date  . Arthritis   . Hypertension   . Chronic a-fib CARDIOLOGIST- DR Gloris Manchester TURNER LAST VISIT 02-20-11 (WILL REQUEST NOTE, STRESS TEST AND ECHO)    UNSUCCESSFUL CARDIOVERSION IN 2010--- CHRONIC COUMADIN THERAPY  . Anticoagulated on warfarin CHRONIC    HX PAF  . Osteoarthritis of knee BILATERAL  . Ankle edema LEFT  . Ureteral stone LEFT  . Diabetes mellitus     ORAL MED  . Macular degeneration of both eyes     RECEIVES AVASTIN INJECTION TO LEFT EYE EVERY 6-8 WKS   Past Surgical History  Procedure Date  . Abdominal hysterectomy 11-12-2000    S/  BSO  . Dilation and curettage of uterus 07-11-10  . Hysteroscopy w/d&c 08-29-2000    RESECTION POLYPS AND MYOMAS  . Cardioversion 11-29-08 FOR A-FIB    Medications: Amiodarone, Cosamin, Fish Oil, Furosemide, Hyzaar, Metformin, Metropolol, Warfarin, Sertraline, Vit D, Vit B complex. Allergies:  Allergies  Allergen Reactions  . Penicillins Rash    History reviewed. No pertinent family history. Social History:  reports that she has never smoked. She has never used smokeless tobacco. She reports that she does not drink alcohol or use illicit drugs.  ROS: All systems are reviewed and negative except as noted.   Physical Exam:  Vital signs in last 24 hours: Temp:  [96.7 F (35.9 C)] 96.7 F (35.9 C) (11/30 1006) Pulse Rate:  [64] 64  (11/30 1006) Resp:  [18] 18  (11/30 1006) BP: (149)/(72) 149/72 mmHg (11/30 1006) SpO2:  [98 %] 98 % (11/30 1006)  Cardiovascular: Skin warm; not flushed Respiratory: Breaths quiet; no shortness of  breath Abdomen: No masses Neurological: Normal sensation to touch Musculoskeletal: Normal motor function arms and legs Lymphatics: No inguinal adenopathy Skin: No rashes Genitourinary:Normal female geitalia  Laboratory Data:  Results for orders placed during the hospital encounter of 05/04/11 (from the past 24 hour(s))  PROTIME-INR     Status: Normal   Collection Time   05/04/11 10:25 AM      Component Value Range   Prothrombin Time 14.6  11.6 - 15.2 (seconds)   INR 1.12  0.00 - 1.49   BASIC METABOLIC PANEL     Status: Abnormal   Collection Time   05/04/11 10:25 AM      Component Value Range   Sodium 135  135 - 145 (mEq/L)   Potassium 3.7  3.5 - 5.1 (mEq/L)   Chloride 98  96 - 112 (mEq/L)   CO2 28  19 - 32 (mEq/L)   Glucose, Bld 156 (*) 70 - 99 (mg/dL)   BUN 22  6 - 23 (mg/dL)   Creatinine, Ser 1.61  0.50 - 1.10 (mg/dL)   Calcium 9.8  8.4 - 09.6 (mg/dL)   GFR calc non Af Amer 88 (*) >90 (mL/min)   GFR calc Af Amer >90  >90 (mL/min)  HEMOGLOBIN AND HEMATOCRIT, BLOOD     Status: Normal   Collection Time   05/04/11 10:25 AM      Component Value Range   Hemoglobin 13.0  12.0 - 15.0 (g/dL)  HCT 37.9  36.0 - 46.0 (%)   Creatinine  Basename 05/04/11 1025  CREATININE 0.63    Xrays:  KUB today today shows a 9 x 12 mm stone in the left  Distal ureter.  Impression/Assessment:  Left distal ureteral calculus.  Plan:  Cystoscopy, Left Retrograde pyelogram, Ureteroscopy, Holmium laser and JJ stent.    Nicolaas Savo-HENRY 05/04/2011, 11:53 AM

## 2011-05-04 NOTE — Progress Notes (Signed)
Dr. Brunilda Payor paged.  To clarify coumadin order.  Pt instructed to start coumadin tomorrow.

## 2011-05-04 NOTE — Anesthesia Procedure Notes (Addendum)
Procedure Name: LMA Insertion Date/Time: 05/04/2011 12:13 PM Performed by: Huel Coventry Pre-anesthesia Checklist: Patient identified, Emergency Drugs available, Suction available and Patient being monitored Patient Re-evaluated:Patient Re-evaluated prior to inductionOxygen Delivery Method: Circle System Utilized Preoxygenation: Pre-oxygenation with 100% oxygen Intubation Type: IV induction Ventilation: Mask ventilation without difficulty LMA: LMA inserted LMA Size: 4.0 Number of attempts: 1 Airway Equipment and Method: bite block Placement Confirmation: positive ETCO2 Tube secured with: Tape Dental Injury: Teeth and Oropharynx as per pre-operative assessment  Comments: Unable to achieve adequate tidal volume with regular LMA. Decided to remove this LMA and replace with Supreme 4 LMA. Dr. Okey Dupre present.   Procedure Name: LMA Insertion Date/Time: 05/04/2011 12:15 PM Performed by: Huel Coventry Pre-anesthesia Checklist: Patient identified, Emergency Drugs available, Suction available and Patient being monitored Patient Re-evaluated:Patient Re-evaluated prior to inductionOxygen Delivery Method: Circle System Utilized Preoxygenation: Pre-oxygenation with 100% oxygen Intubation Type: IV induction Ventilation: Mask ventilation without difficulty LMA: LMA with gastric port inserted LMA Size: 4.0 Number of attempts: 1 Placement Confirmation: positive ETCO2 Tube secured with: Tape Dental Injury: Teeth and Oropharynx as per pre-operative assessment

## 2011-05-11 ENCOUNTER — Encounter (HOSPITAL_BASED_OUTPATIENT_CLINIC_OR_DEPARTMENT_OTHER): Payer: Self-pay | Admitting: Urology

## 2011-06-07 ENCOUNTER — Ambulatory Visit (HOSPITAL_COMMUNITY)
Admission: RE | Admit: 2011-06-07 | Discharge: 2011-06-07 | Disposition: A | Discharge status: Home / Self Care | Payer: Medicare Other | Source: Ambulatory Visit | Attending: Family Medicine | Admitting: Family Medicine

## 2011-06-07 DIAGNOSIS — Z1231 Encounter for screening mammogram for malignant neoplasm of breast: Secondary | ICD-10-CM

## 2011-09-07 ENCOUNTER — Encounter (HOSPITAL_COMMUNITY): Payer: Self-pay | Admitting: Respiratory Therapy

## 2011-09-13 ENCOUNTER — Other Ambulatory Visit: Payer: Self-pay | Admitting: Cardiology

## 2011-09-17 ENCOUNTER — Other Ambulatory Visit: Payer: Self-pay | Admitting: Cardiology

## 2011-09-17 ENCOUNTER — Encounter: Payer: Self-pay | Admitting: Cardiology

## 2011-09-17 NOTE — H&P (Signed)
Office Visit     Patient: Shelia [name] Provider: Marzelle Rutten, MD  DOB: 05/16/1941 Age: 70 Y Sex: Female Date: 09/07/2011  Phone: [phone]   Address: [address], [place], [place]  Pcp: Shelia [name]       Subjective:     CC:    1. 6MO FOLLOW UP and Shelia [name].        HPI:  General:  The patient presents today for followup of her afib/HTN and sinus node dysfunction. She is doing well. She denies any chest pain, SOB, DOE, LE edema, dizziness, palpitations or syncope. .        ROS:  See HPI, A twelve system review was perfomed at today's visit. For pertinent positives and negatives see HPI.       Medical History: Hypertension, Osteoarthritis, Anxiety, Paroxysmal atrial fibrillation s/p cardioversion 11/29/2008, Systemic anti-coagulation, macular degeneration, bilateral, followed by SheliaReese, [facility], microscopic hematuria, large ureteral stone, renal cyst 03/2011, followed by Shelia [name], [facility]. Calcium Oxalate.        Surgical History: hysterectomy 11/2000.        Family History: Father: deceased 87 yrs heart trouble, borderline diabetes,colon cancer suvivor Mother: deceased 90 yrs CHF Brother 1: deceased 49 yrs aortic disscetion Brother2: alive arrhythmia Brother 3: alive OSA Sister 1: alive pacemaker 3 brother(s) , 3 sister(s) .        Social History:  General:  History of smoking cigarettes: Never smoked.  no Smoking.  no Tobacco Exposure.  no Alcohol.  no Caffeine.  no Recreational drug use.  Diet: low fat, low salt, calorie restricted, 5 servings of fruits and veggies daily, and chicken and turkey, no red meat.  Exercise: water aerobics most days of the week.  Occupation: retired, does volunteer at [employer]. Still works as beautician..  Marital Status: single.  Children: 2.  Seat belt use: yes.        Medications: Calcium 600 MG Tablet 1 tablet Twice a day, Fish Oil 1000 MG Capsule 1 capsules twice a day, ICaps MV Tablet 1 tablet twice a  day, Vitamin [name] Complex Tablet 1 tablet once a day, Vitamin D 1000 IU 1 tablet once a day, Avastin as directed every 8 weeks, Amiodarone HCl 200MG Tablet 1 tablet once a day, Cosamin DS 500-400 MG Capsule 1 capsule after meals twice a day, Furosemide 40MG Tablet 1 tablet once a day, Metoprolol Tartrate 50 MG Tablet 1/2 tablet twice a day, Potassium Citrate 10 MEQ (1080 MG) Tablet Extended Release 1 tablet with meals Three times a day, Metformin HCl 500mg er Tablet Extended Release 24 Hour 1 tablet once a day, Losartan Potassium-HCTZ 50/12.5 Tablet 1 tablet once a day, Warfarin Sodium 5 MG Tablet per PharmD 5 mg qd except 2.5 mg Th, Medication List reviewed and reconciled with the patient       Allergies: penicillin: rash: Allergy.       Objective:     Vitals: Wt 209, Wt change -1 lb, Ht 63, BMI 37.02, Pulse sitting 80, BP sitting 158/82, Repeat BP 142/92.       Examination:  Cardiology, General:  GENERAL APPEARANCE: pleasant, NAD.  HEENT: unremarkable.  CAROTID UPSTROKE: normal, no bruit.  JVD: flat.  HEART SOUNDS: regular, normal S1, S2, no S3 or S4.  MURMUR: absent.  LUNGS: no rales or wheezes.  ABDOMEN: soft, non tender, positive bowel sounds, no masses felt.  EXTREMITIES: no leg edema.  PERIPHERAL PULSES: 2 plus bilateral.        Assessment:       Assessment:  1. Atrial fibrillation - 427.31 (Primary)  2. Benign hypertensive heart disease without heart failure - 402.10  3. Sinoatrial node dysfunction - 427.81  4. Encounter for long-term (current) use of anticoagulants - V58.61  5. Encounter for long-term (current) use of med (excludes anticoagulant, NSAIDS, steroids, aspirin, insulin) - V58.69    Plan:     1. Atrial fibrillation Continue Warfarin Sodium Tablet, 5 MG, per PharmD, Orally, 5 mg qd except 2.5 mg Th ; Continue Metoprolol Tartrate Tablet, 50 MG, 1/2 tablet, Orally, twice a day ; Continue Amiodarone HCl Tablet, 200MG, 1 tablet, once a day .  LAB: Amiodarone  (Cordarone(R)), Serum (706705)    Amiodarone, Serum 1.3 1.0-2.5 - UG/ML    Noramiodarone,S 1.0 1.0-2.5 - UG/ML     Shelia [name] M 09/11/2011 02:45:11 PM > amiodarone level therapuetic - please set up for outpatient cardioversion SheliaLinda 09/11/2011 04:33:48 PM > cardioversion scheduled for 09/18/2011 @ 2:00, pt notified    Diagnostic Imaging:EKG SheliaShelia [name] 09/07/2011 12:05:53 PM > EKG done on old machine and placed in scan box.  EKG today shows atrial flutter with CVR and variable block.  She has been therapeutic with her INR so I will set her up to get a DCCV in the near future but I would like to get an amiodarone level to make sure she is therapeutic.       2. Benign hypertensive heart disease without heart failure Continue Metoprolol Tartrate Tablet, 50 MG, 1/2 tablet, Orally, twice a day ; Continue Losartan Potassium-HCTZ Tablet, 50/12.5, 1 tablet, once a day .       3. Encounter for long-term (current) use of anticoagulants  The patient's PT/INR will be checked in clininc today and anticoagulation adjusted as needed and reviewed by me - refer to coumadin clinic note.        Immunizations:        Labs:        Procedure Codes: 36415 BLOOD COLLECTION ROUTINE VENIPUNCTURE, 93000 EKG I AND R       Preventive:         Follow Up: 6 Months (Reason: afib/HTN)      Provider: Chizara Mena, MD  Patient: Shelia [name] DOB: 12/23/1940 Date: 09/07/2011     

## 2011-09-18 ENCOUNTER — Encounter (HOSPITAL_COMMUNITY): Payer: Self-pay | Admitting: Anesthesiology

## 2011-09-18 ENCOUNTER — Ambulatory Visit (HOSPITAL_COMMUNITY)
Admission: RE | Admit: 2011-09-18 | Discharge: 2011-09-18 | Disposition: A | Discharge status: Home / Self Care | Payer: Medicare Other | Source: Ambulatory Visit | Attending: Cardiology | Admitting: Cardiology

## 2011-09-18 ENCOUNTER — Encounter (HOSPITAL_COMMUNITY)
Admission: RE | Disposition: A | Discharge status: Home / Self Care | Payer: Self-pay | Source: Ambulatory Visit | Attending: Cardiology

## 2011-09-18 ENCOUNTER — Ambulatory Visit (HOSPITAL_COMMUNITY): Payer: Medicare Other | Admitting: Anesthesiology

## 2011-09-18 DIAGNOSIS — I10 Essential (primary) hypertension: Secondary | ICD-10-CM | POA: Insufficient documentation

## 2011-09-18 DIAGNOSIS — Z87442 Personal history of urinary calculi: Secondary | ICD-10-CM | POA: Insufficient documentation

## 2011-09-18 DIAGNOSIS — I495 Sick sinus syndrome: Secondary | ICD-10-CM | POA: Insufficient documentation

## 2011-09-18 DIAGNOSIS — M1990 Unspecified osteoarthritis, unspecified site: Secondary | ICD-10-CM | POA: Insufficient documentation

## 2011-09-18 DIAGNOSIS — F411 Generalized anxiety disorder: Secondary | ICD-10-CM | POA: Insufficient documentation

## 2011-09-18 DIAGNOSIS — I4891 Unspecified atrial fibrillation: Secondary | ICD-10-CM | POA: Insufficient documentation

## 2011-09-18 DIAGNOSIS — H3530 Unspecified macular degeneration: Secondary | ICD-10-CM | POA: Insufficient documentation

## 2011-09-18 HISTORY — PX: CARDIOVERSION: SHX1299

## 2011-09-18 LAB — GLUCOSE, CAPILLARY: Glucose-Capillary: 146 mg/dL — ABNORMAL HIGH (ref 70–99)

## 2011-09-18 LAB — PROTIME-INR: Prothrombin Time: 26 seconds — ABNORMAL HIGH (ref 11.6–15.2)

## 2011-09-18 SURGERY — CARDIOVERSION
Anesthesia: General | Wound class: Clean

## 2011-09-18 MED ORDER — DEXTROSE 50 % IV SOLN
25.0000 mL | Freq: Once | INTRAVENOUS | Status: AC | PRN
  Administered 2011-09-18: 25 mL via INTRAVENOUS
  Filled 2011-09-18: qty 50

## 2011-09-18 MED ORDER — SODIUM CHLORIDE 0.9 % IJ SOLN: 3.0000 mL | INTRAMUSCULAR | Status: DC | PRN

## 2011-09-18 MED ORDER — SODIUM CHLORIDE 0.9 % IJ SOLN: 3.0000 mL | Freq: Two times a day (BID) | INTRAMUSCULAR | Status: DC

## 2011-09-18 MED ORDER — ONDANSETRON HCL 4 MG/2ML IJ SOLN: 4.0000 mg | Freq: Once | INTRAMUSCULAR | Status: DC | PRN

## 2011-09-18 MED ORDER — SODIUM CHLORIDE 0.9 % IV SOLN
INTRAVENOUS | Status: DC | PRN
  Administered 2011-09-18: 14:00:00 via INTRAVENOUS

## 2011-09-18 MED ORDER — HYDROCORTISONE 1 % EX CREA
1.0000 "application " | TOPICAL_CREAM | Freq: Three times a day (TID) | CUTANEOUS | Status: DC | PRN
  Filled 2011-09-18: qty 28

## 2011-09-18 MED ORDER — DEXTROSE 50 % IV SOLN
INTRAVENOUS | Status: AC
  Filled 2011-09-18: qty 50

## 2011-09-18 MED ORDER — SODIUM CHLORIDE 0.9 % IV SOLN: 250.0000 mL | INTRAVENOUS | Status: DC

## 2011-09-18 MED ORDER — HYDROMORPHONE HCL PF 1 MG/ML IJ SOLN: 0.2500 mg | INTRAMUSCULAR | Status: DC | PRN

## 2011-09-18 MED ORDER — PROPOFOL 10 MG/ML IV EMUL
INTRAVENOUS | Status: DC | PRN
  Administered 2011-09-18: 65 mg via INTRAVENOUS

## 2011-09-18 NOTE — Interval H&P Note (Signed)
History and Physical Interval Note:  09/18/2011 1:33 PM  [name]  has presented today for surgery, with the diagnosis of AFIB  The various methods of treatment have been discussed with the patient and family. After consideration of risks, benefits and other options for treatment, the patient has consented to  Procedure(s) (LRB): CARDIOVERSION (N/A) as a surgical intervention .  The patients' history has been reviewed, patient examined, no change in status, stable for surgery.  I have reviewed the patients' chart and labs.  Questions were answered to the patient's satisfaction.     Harrington Jobe R

## 2011-09-18 NOTE — Discharge Instructions (Signed)
Electrical Cardioversion Cardioversion is the delivery of a jolt of electricity to change the rhythm of the heart. Sticky patches or metal paddles are placed on the chest to deliver the electricity from a special device. This is done to restore a normal rhythm. A rhythm that is too fast or not regular keeps the heart from pumping well. Compared to medicines used to change an abnormal rhythm, cardioversion is faster and works better. It is also unpleasant and may dislodge blood clots from the heart. WHEN WOULD THIS BE DONE?  In an emergency:   There is low or no blood pressure as a result of the heart rhythm.   Normal rhythm must be restored as fast as possible to protect the brain and heart from further damage.   It may save a life.   For less serious heart rhythms, such as atrial fibrillation or flutter, in which:   The heart is beating too fast or is not regular.   The heart is still able to pump enough blood, but not as well as it should.   Medicine to change the rhythm has not worked.   It is safe to wait in order to allow time for preparation.  LET YOUR CAREGIVER KNOW ABOUT:   Every medicine you are taking. It is very important to do this! Know when to take or stop taking any of them.   Any time in the past that you have felt your heart was not beating normally.  RISKS AND COMPLICATIONS   Clots may form in the chambers of the heart if it is beating too fast. These clots may be dislodged during the procedure and travel to other parts of the body.   There is risk of a stroke during and after the procedure if a clot moves. Blood thinners lower this risk.   You may have a special test of your heart (TEE) to make sure there are no clots in your heart.  BEFORE THE PROCEDURE   You may have some tests to see how well your heart is working.   You may start taking blood thinners so your blood does not clot as easily.   Other drugs may be given to help your heart work better.   PROCEDURE (SCHEDULED)  The procedure is typically done in a hospital by a heart doctor (cardiologist).   You will be told when and where to go.   You may be given some medicine through an intravenous (IV) access to reduce discomfort and make you sleepy before the procedure.   Your whole body may move when the shock is delivered. Your chest may feel sore.   You may be able to go home after a few hours. Your heart rhythm will be watched to make sure it does not change.  HOME CARE INSTRUCTIONS   Only take medicine as directed by your caregiver. Be sure you understand how and when to take your medicine.   Learn how to feel your pulse and check it often.   Limit your activity for 48 hours.   Avoid caffeine and other stimulants as directed.  SEEK MEDICAL CARE IF:   You feel like your heart is beating too fast or your pulse is not regular.   You have any questions about your medicines.   You have bleeding that will not stop.  SEEK IMMEDIATE MEDICAL CARE IF:   You are dizzy or feel faint.   It is hard to breathe or you feel short of breath.     There is a change in discomfort in your chest.   Your speech is slurred or you have trouble moving your arm or leg on one side.   You get a muscle cramp.   Your fingers or toes turn cold or blue.  MAKE SURE YOU:   Understand these instructions.   Will watch your condition.   Will get help right away if you are not doing well or get worse.  Document Released: 05/11/2002 Document Revised: 05/10/2011 Document Reviewed: 09/10/2007 [employer] Patient Information 2012 [employer]. 

## 2011-09-18 NOTE — Op Note (Signed)
Electrical Cardioversion Procedure Note [name] [id number] [birth year]  Procedure: Electrical Cardioversion Indications:  Atrial Fibrillation  Time Out: Verified patient identification, verified procedure,medications/allergies/relevent history reviewed, required imaging and test results available.  Performed  Procedure Details  The patient was NPO after midnight. Anesthesia was administered at the beside  by Dr.Joslin with 60mg  of propofol.  Cardioversion was done with synchronized biphasic defibrillation with AP pads with 150watts.  The patient converted to normal sinus rhythm but immediately went back into atrial fibrillation. Repeat cardioversion was done with synchronized biphasic defibrillation with AP pads with 200watts but was unsuccessful at converting patient to NSR.  The patient tolerated the procedure well   IMPRESSION:  Unsuccessful cardioversion of atrial fibrillation  PLAN:  1.  Given patient's symptoms of DOE I have recommended EP consult for consideration of ablation.  My office will set this up. 2.  Continue current medications 3.  Followup with my NP in 4 weeks    Shyne Resch R 09/18/2011, 1:34 PM

## 2011-09-18 NOTE — Anesthesia Postprocedure Evaluation (Signed)
  Anesthesia Post-op Note  Patient: [name]  Procedure(s) Performed: Procedure(s) (LRB): CARDIOVERSION (N/A)  Patient Location: PACU and Short Stay  Anesthesia Type: MAC  Level of Consciousness: awake, alert  and patient cooperative  Airway and Oxygen Therapy: Patient Spontanous Breathing and Patient connected to nasal cannula oxygen  Post-op Pain: none  Post-op Assessment: Post-op Vital signs reviewed, Patient's Cardiovascular Status Stable and No signs of Nausea or vomiting  Post-op Vital Signs: Reviewed and stable  Complications: No apparent anesthesia complications

## 2011-09-18 NOTE — Preoperative (Signed)
Beta Blockers   Reason not to administer Beta Blockers:Not Applicable 

## 2011-09-18 NOTE — H&P (View-Only) (Signed)
Office Visit     Patient: [name], [name] Provider: Armanda Magic, MD  DOB: Oct 04, 1940 Age: 71 Y Sex: Female Date: 09/07/2011  Phone: [phone]   Address: [address], [place], [phone]  Pcp: Marinda Elk       Subjective:     CC:    1. FOLLOW UP and Riki Rusk.        HPI:  General:  The patient presents today for followup of her afib/HTN and sinus node dysfunction. She is doing well. She denies any chest pain, SOB, DOE, LE edema, dizziness, palpitations or syncope. .        ROS:  See HPI, A twelve system review was perfomed at today's visit. For pertinent positives and negatives see HPI.       Medical History: Hypertension, Osteoarthritis, Anxiety, Paroxysmal atrial fibrillation s/p cardioversion 11/29/2008, Systemic anti-coagulation, macular degeneration, bilateral, followed by Dr.Reese, [facility], microscopic hematuria, large ureteral stone, renal cyst 03/2011, followed by Dr. Brunilda Payor, [facility]. Calcium Oxalate.        Surgical History: hysterectomy 11/2000.        Family History: Father: deceased 47 yrs heart trouble, borderline diabetes,colon cancer suvivor Mother: deceased 12 yrs CHF Brother 1: deceased 32 yrs aortic disscetion Brother2: alive arrhythmia Brother 3: alive OSA Sister 1: alive pacemaker 3 brother(s) , 3 sister(s) .        Social History:  General:  History of smoking cigarettes: Never smoked.  no Smoking.  no Tobacco Exposure.  no Alcohol.  no Caffeine.  no Recreational drug use.  Diet: low fat, low salt, calorie restricted, 5 servings of fruits and veggies daily, and chicken and [place], no red meat.  Exercise: water aerobics most days of the week.  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  Marital Status: single.  Children: 2.  Seat belt use: yes.        Medications: Calcium 600 MG Tablet 1 tablet Twice a day, Fish Oil 1000 MG Capsule 1 capsules twice a day, ICaps MV Tablet 1 tablet twice a  day, Vitamin [name] Complex Tablet 1 tablet once a day, Vitamin D 1000 IU 1 tablet once a day, Avastin as directed every 8 weeks, Amiodarone HCl 200MG  Tablet 1 tablet once a day, Cosamin DS 500-400 MG Capsule 1 capsule after meals twice a day, Furosemide 40MG  Tablet 1 tablet once a day, Metoprolol Tartrate 50 MG Tablet 1/2 tablet twice a day, Potassium Citrate 10 MEQ (1080 MG) Tablet Extended Release 1 tablet with meals Three times a day, Metformin HCl 500mg  er Tablet Extended Release 24 Hour 1 tablet once a day, Losartan Potassium-HCTZ 50/12.5 Tablet 1 tablet once a day, Warfarin Sodium 5 MG Tablet per PharmD 5 mg qd except 2.5 mg Th, Medication List reviewed and reconciled with the patient       Allergies: penicillin: rash: Allergy.       Objective:     Vitals: Wt 209, Wt change -1 lb, Ht 63, BMI 37.02, Pulse sitting 80, BP sitting 158/82, Repeat BP 142/92.       Examination:  Cardiology, General:  GENERAL APPEARANCE: pleasant, NAD.  HEENT: unremarkable.  CAROTID UPSTROKE: normal, no bruit.  JVD: flat.  HEART SOUNDS: regular, normal S1, S2, no S3 or S4.  MURMUR: absent.  LUNGS: no rales or wheezes.  ABDOMEN: soft, non tender, positive bowel sounds, no masses felt.  EXTREMITIES: no leg edema.  PERIPHERAL PULSES: 2 plus bilateral.        Assessment:  Assessment:  1. Atrial fibrillation - 427.31 (Primary)  2. Benign hypertensive heart disease without heart failure - 402.10  3. Sinoatrial node dysfunction - 427.81  4. Encounter for long-term (current) use of anticoagulants - V58.61  5. Encounter for long-term (current) use of med (excludes anticoagulant, NSAIDS, steroids, aspirin, insulin) - V58.69    Plan:     1. Atrial fibrillation Continue Warfarin Sodium Tablet, 5 MG, per PharmD, Orally, 5 mg qd except 2.5 mg Th ; Continue Metoprolol Tartrate Tablet, 50 MG, 1/2 tablet, Orally, twice a day ; Continue Amiodarone HCl Tablet, 200MG , 1 tablet, once a day .  LAB: Amiodarone  (Cordarone(R)), Serum (696295)    Amiodarone, Serum 1.3 1.0-2.5 - UG/ML    Noramiodarone,S 1.0 1.0-2.5 - UG/ML     Hester Forget M 09/11/2011 02:45:11 PM > amiodarone level therapuetic - please set up for outpatient cardioversion McVey,Linda 09/11/2011 04:33:48 PM > cardioversion scheduled for 09/18/2011 @ 2:00, pt notified    Diagnostic Imaging:EKG Redmond Baseman 09/07/2011 12:05:53 PM > EKG done on old machine and placed in scan box.  EKG today shows atrial flutter with CVR and variable block.  She has been therapeutic with her INR so I will set her up to get a DCCV in the near future but I would like to get an amiodarone level to make sure she is therapeutic.       2. Benign hypertensive heart disease without heart failure Continue Metoprolol Tartrate Tablet, 50 MG, 1/2 tablet, Orally, twice a day ; Continue Losartan Potassium-HCTZ Tablet, 50/12.5, 1 tablet, once a day .       3. Encounter for long-term (current) use of anticoagulants  The patient's PT/INR will be checked in clininc today and anticoagulation adjusted as needed and reviewed by me - refer to coumadin clinic note.        Immunizations:        Labs:        Procedure Codes: 28413 BLOOD COLLECTION ROUTINE VENIPUNCTURE, 93000 EKG I AND R       Preventive:         Follow Up: 6 Months (Reason: afib/HTN)      Provider: Armanda Magic, MD  Patient: [name] DOB: June 19, 1940 Date: 09/07/2011

## 2011-09-18 NOTE — Anesthesia Preprocedure Evaluation (Signed)
Anesthesia Evaluation  Patient identified by MRN, date of birth, ID band Patient awake    Reviewed: Allergy & Precautions, H&P , NPO status , Patient's Chart, lab work & pertinent test results, reviewed documented beta blocker date and time   Airway Mallampati: II TM Distance: >3 FB Neck ROM: Full    Dental   Pulmonary  breath sounds clear to auscultation        Cardiovascular Rhythm:Irregular Rate:Normal     Neuro/Psych    GI/Hepatic   Endo/Other    Renal/GU      Musculoskeletal   Abdominal   Peds  Hematology   Anesthesia Other Findings   Reproductive/Obstetrics                           Anesthesia Physical Anesthesia Plan  ASA: III  Anesthesia Plan: General   Post-op Pain Management:    Induction: Intravenous  Airway Management Planned: Mask  Additional Equipment:   Intra-op Plan:   Post-operative Plan:   Informed Consent: I have reviewed the patients History and Physical, chart, labs and discussed the procedure including the risks, benefits and alternatives for the proposed anesthesia with the patient or authorized representative who has indicated his/her understanding and acceptance.     Plan Discussed with: CRNA and Surgeon  Anesthesia Plan Comments:         Anesthesia Quick Evaluation

## 2011-09-18 NOTE — Transfer of Care (Signed)
Immediate Anesthesia Transfer of Care Note  Patient: [name]  Procedure(s) Performed: Procedure(s) (LRB): CARDIOVERSION (N/A)  Patient Location: PACU and Short Stay  Anesthesia Type: MAC  Level of Consciousness: awake, alert  and patient cooperative  Airway & Oxygen Therapy: Patient Spontanous Breathing and Patient connected to nasal cannula oxygen  Post-op Assessment: Report given to PACU RN and Post -op Vital signs reviewed and stable  Post vital signs: Reviewed and stable  Complications: No apparent anesthesia complications

## 2011-09-19 ENCOUNTER — Encounter (HOSPITAL_COMMUNITY): Payer: Self-pay | Admitting: Cardiology

## 2011-11-19 ENCOUNTER — Other Ambulatory Visit: Payer: Self-pay | Admitting: Internal Medicine

## 2011-11-19 DIAGNOSIS — E0580 Other thyrotoxicosis without thyrotoxic crisis or storm: Secondary | ICD-10-CM

## 2011-11-22 ENCOUNTER — Ambulatory Visit
Admission: RE | Admit: 2011-11-22 | Discharge: 2011-11-22 | Disposition: A | Discharge status: Home / Self Care | Payer: Medicare Other | Source: Ambulatory Visit | Attending: Internal Medicine | Admitting: Internal Medicine

## 2011-11-22 DIAGNOSIS — E0580 Other thyrotoxicosis without thyrotoxic crisis or storm: Secondary | ICD-10-CM

## 2012-06-09 ENCOUNTER — Other Ambulatory Visit: Payer: Self-pay | Admitting: Orthopedic Surgery

## 2012-06-09 MED ORDER — DEXAMETHASONE SODIUM PHOSPHATE 10 MG/ML IJ SOLN: 10.0000 mg | Freq: Once | INTRAMUSCULAR | Status: DC

## 2012-06-09 MED ORDER — BUPIVACAINE LIPOSOME 1.3 % IJ SUSP: 20.0000 mL | Freq: Once | INTRAMUSCULAR | Status: DC

## 2012-06-09 NOTE — Progress Notes (Signed)
Preoperative surgical orders have been place into the Epic hospital system for [name] on 06/09/2012, 10:52 AM  by Patrica Duel for surgery on 07/21/2012.  Preop Total Knee orders including Experal, IV Tylenol, and IV Decadron as long as there are no contraindications to the above medications. [name], PA-C

## 2012-06-26 ENCOUNTER — Other Ambulatory Visit (HOSPITAL_COMMUNITY): Payer: Self-pay | Admitting: Family Medicine

## 2012-06-26 DIAGNOSIS — Z1231 Encounter for screening mammogram for malignant neoplasm of breast: Secondary | ICD-10-CM

## 2012-07-03 ENCOUNTER — Ambulatory Visit (HOSPITAL_COMMUNITY)
Admission: RE | Admit: 2012-07-03 | Discharge: 2012-07-03 | Disposition: A | Discharge status: Home / Self Care | Payer: Medicare Other | Source: Ambulatory Visit | Attending: Family Medicine | Admitting: Family Medicine

## 2012-07-03 DIAGNOSIS — Z1231 Encounter for screening mammogram for malignant neoplasm of breast: Secondary | ICD-10-CM

## 2012-07-10 ENCOUNTER — Encounter (HOSPITAL_COMMUNITY): Payer: Self-pay | Admitting: Pharmacy Technician

## 2012-07-14 ENCOUNTER — Ambulatory Visit (HOSPITAL_COMMUNITY)
Admission: RE | Admit: 2012-07-14 | Discharge: 2012-07-14 | Disposition: A | Discharge status: Home / Self Care | Payer: Medicare Other | Source: Ambulatory Visit | Attending: Orthopedic Surgery | Admitting: Orthopedic Surgery

## 2012-07-14 ENCOUNTER — Encounter (HOSPITAL_COMMUNITY): Payer: Self-pay

## 2012-07-14 ENCOUNTER — Encounter (HOSPITAL_COMMUNITY)
Admission: RE | Admit: 2012-07-14 | Discharge: 2012-07-14 | Disposition: A | Discharge status: Home / Self Care | Payer: Medicare Other | Source: Ambulatory Visit | Attending: Orthopedic Surgery | Admitting: Orthopedic Surgery

## 2012-07-14 DIAGNOSIS — M40299 Other kyphosis, site unspecified: Secondary | ICD-10-CM | POA: Insufficient documentation

## 2012-07-14 DIAGNOSIS — Z01812 Encounter for preprocedural laboratory examination: Secondary | ICD-10-CM | POA: Insufficient documentation

## 2012-07-14 DIAGNOSIS — M1710 Unilateral primary osteoarthritis, unspecified knee: Secondary | ICD-10-CM | POA: Insufficient documentation

## 2012-07-14 HISTORY — DX: Sleep apnea, unspecified: G47.30

## 2012-07-14 LAB — URINALYSIS, ROUTINE W REFLEX MICROSCOPIC
Bilirubin Urine: NEGATIVE
Hgb urine dipstick: NEGATIVE
Ketones, ur: NEGATIVE mg/dL
Nitrite: NEGATIVE
pH: 8 (ref 5.0–8.0)

## 2012-07-14 LAB — PROTIME-INR
INR: 1.76 — ABNORMAL HIGH (ref 0.00–1.49)
Prothrombin Time: 19.9 seconds — ABNORMAL HIGH (ref 11.6–15.2)

## 2012-07-14 LAB — COMPREHENSIVE METABOLIC PANEL
Albumin: 4.1 g/dL (ref 3.5–5.2)
BUN: 15 mg/dL (ref 6–23)
Creatinine, Ser: 0.5 mg/dL (ref 0.50–1.10)
GFR calc Af Amer: >90 mL/min (ref >90)
Total Bilirubin: 0.9 mg/dL (ref 0.3–1.2)
Total Protein: 8.3 g/dL (ref 6.0–8.3)

## 2012-07-14 LAB — CBC
HCT: 41 % (ref 36.0–46.0)
MCHC: 34.4 g/dL (ref 30.0–36.0)
MCV: 89.3 fL (ref 78.0–100.0)
RDW: 12.2 % (ref 11.5–15.5)

## 2012-07-14 LAB — SURGICAL PCR SCREEN: MRSA, PCR: NEGATIVE

## 2012-07-14 NOTE — Pre-Procedure Instructions (Signed)
PREOP CBC, CMET, PT, PTT, UA, T/S, CXR WERE DONE TODAY AT [facility] AS PER ORDERS DR. Lequita Halt AND ANESTHESIOLOGIST'S GUIDELINES. PT HAS EKG REPORT FROM DR. T. TURNER 02/08/12 AND OFFICE NOTE DR. Mayford Knife FROM 06/13/12. PT'S CARDIOLOGY OFFICE NOTE AND EKG FROM DR. [facility] WITH [facility] REQUESTED-PT STATES SHE SAW HIM 07/10/12.  PT STATES SHE HAS 2 DAY STRESS TEST SCHEDULED WITH DR. Mayford Knife TUES 2/11 AND WED 2/12 --STATES DR. BAHNSON WANTED THE STRESS TEST RESULTS BEFORE GIVING HER CLEARANCE FOR KNEE REPLACEMENT.

## 2012-07-14 NOTE — Patient Instructions (Addendum)
YOUR SURGERY IS SCHEDULED AT [facility]  ON:  Monday  2/17  REPORT TO [place] [facility] SHORT STAY CENTER AT:  7:30 AM      PHONE # FOR SHORT STAY IS [phone]  DO NOT EAT OR DRINK ANYTHING AFTER MIDNIGHT THE NIGHT BEFORE YOUR SURGERY.  YOU MAY BRUSH YOUR TEETH, RINSE OUT YOUR MOUTH--BUT NO WATER, NO FOOD, NO CHEWING GUM, NO MINTS, NO CANDIES, NO CHEWING TOBACCO.  PLEASE TAKE THE FOLLOWING MEDICATIONS THE AM OF YOUR SURGERY WITH A FEW SIPS OF WATER:  DILTIAZEM AND METOPROLOL  IF YOU ARE DIABETIC:  DO NOT TAKE ANY DIABETIC MEDICATIONS THE AM OF YOUR SURGERY.  IF YOU TAKE INSULIN IN THE EVENINGS--PLEASE ONLY TAKE 1/2 NORMAL EVENING DOSE THE NIGHT BEFORE YOUR SURGERY.  NO INSULIN THE AM OF YOUR SURGERY.  IF YOU HAVE SLEEP APNEA AND USE CPAP OR BIPAP--PLEASE BRING THE MASK AND THE TUBING.  DO NOT BRING YOUR MACHINE.  DO NOT BRING VALUABLES, MONEY, CREDIT CARDS.  DO NOT WEAR JEWELRY, MAKE-UP, NAIL POLISH AND NO METAL PINS OR CLIPS IN YOUR HAIR. CONTACT LENS, DENTURES / PARTIALS, GLASSES SHOULD NOT BE WORN TO SURGERY AND IN MOST CASES-HEARING AIDS WILL NEED TO BE REMOVED.  BRING YOUR GLASSES CASE, ANY EQUIPMENT NEEDED FOR YOUR CONTACT LENS. FOR PATIENTS ADMITTED TO THE HOSPITAL--CHECK OUT TIME THE DAY OF DISCHARGE IS 11:00 AM.  ALL INPATIENT ROOMS ARE PRIVATE - WITH BATHROOM, TELEPHONE, TELEVISION AND WIFI INTERNET.  IF YOU ARE BEING DISCHARGED THE SAME DAY OF YOUR SURGERY--YOU CAN NOT DRIVE YOURSELF HOME--AND SHOULD NOT GO HOME ALONE BY TAXI OR BUS.  NO DRIVING OR OPERATING MACHINERY FOR 24 HOURS FOLLOWING ANESTHESIA / PAIN MEDICATIONS.  PLEASE MAKE ARRANGEMENTS FOR SOMEONE TO BE WITH YOU AT HOME THE FIRST 24 HOURS AFTER SURGERY. RESPONSIBLE DRIVER'S NAME___________________________                                               PHONE #   _______________________                               PLEASE READ OVER ANY  FACT SHEETS THAT YOU WERE GIVEN: MRSA INFORMATION, BLOOD TRANSFUSION INFORMATION,  INCENTIVE SPIROMETER INFORMATION. FAILURE TO FOLLOW THESE INSTRUCTIONS MAY RESULT IN THE CANCELLATION OF YOUR SURGERY.   PATIENT SIGNATURE_________________________________  PT NOTIFIED HER SURGERY TIME WAS MOVED TO 11:35 AM AND SHE SHOULD ARRIVE TO SHORT STAY BY 8:30 AM ON Monday  07/21/12.

## 2012-07-15 ENCOUNTER — Other Ambulatory Visit: Payer: Self-pay | Admitting: Orthopedic Surgery

## 2012-07-15 NOTE — H&P (Signed)
[name]  DOB: 1941-05-12 Divorced / Language: English / Race: White Female  Date of Admission:  07/21/2012  Chief  Complaint:  Right Knee Pain  History of Present Illness The patient is a 72 year old female who comes in for a preoperative History and Physical. The patient is scheduled for a right total knee arthroplasty to be performed by Dr. Gus Rankin. Aluisio, MD at [facility] on 07/21/2012. The patient is a 72 year old female who presents with knee complaints. The patient was seen for a second opinion. The patient reports right knee (worse than left) symptoms including: pain, instability, giving way, weakness and stiffness which began year(s) ago without any known injury. The patient describes the severity of the symptoms as moderate in severity.The patient feels that the symptoms are worsening. The patient has the current diagnosis of knee osteoarthritis. Prior to being seen today the patient was previously evaluated by a colleague. Previous work-up for this problem has included knee x-rays. Past treatment for this problem has included intra-articular injection of corticosteroids (as well as Synvisc-One in the right knee) and nonsteroidal anti-inflammatory drugs (Celebrex, stopped due to stomach upset). Current treatment includes opioid analgesics (ultracet). She says she has significant pain and dysfunction in the right worse than left knee. Both knees are bothering her, but the right is worse. She said she was told by her primary doctor that she should wait until she can't walk anymore before she has anything done. She's at that stage now where she can barely even move. Dr. Otelia Sergeant has treated her in the past with injections but she wanted to come to me for consideration of surgical treatment. She says the injections were no longer beneficial. She's at a stage where she is hurting all the time. It's limiting what she can and cannot do. She used to be very active but  is no longer active due to the pain. she is ready to proceed with her knee surgery. They have been treated conservatively in the past for the above stated problem and despite conservative measures, they continue to have progressive pain and severe functional limitations and dysfunction. They have failed non-operative management including home exercise, medications, and injections. It is felt that they would benefit from undergoing total joint replacement. Risks and benefits of the procedure have been discussed with the patient and they elect to proceed with surgery. There are no active contraindications to surgery such as ongoing infection or rapidly progressive neurological disease.   Problem List Primary osteoarthritis of both knees (715.16)   Allergies Penicillins. Rash, Itching.   Family History Heart Disease. mother, father and grandfather fathers side Heart disease in female family member before age 21 Hypertension. mother Diabetes Mellitus. father Cancer. father Congestive Heart Failure. mother, father and grandfather fathers side Father. Deceased, Congestive Heart Failure. age 73 Mother. Deceased, Congestive Heart Failure. age 57   Social History Drug/Alcohol Rehab (Currently). no Drug/Alcohol Rehab (Previously). no Illicit drug use. no Current work status. retired Alcohol use. never consumed alcohol Children. 2 Tobacco use. never smoker Marital status. divorced Pain Contract. no Tobacco / smoke exposure. no Post-Surgical Plans. Plan is to go home with family. Advance Directives. Living Will, Healthcare POA   Medication History Tramadol-Acetaminophen (37.5-325MG  Tablet, Oral) Active. Citalopram Hydrobromide (10MG  Tablet, Oral) Active. Warfarin Sodium (5MG  Tablet, Oral) Active. Vitamin B1 ( Oral) Specific dose unknown - Active. Potassium Citrate ER (10 MEQ(1080 MG) Tablet ER, Oral) Active. Omega 3 ( Oral) Specific dose unknown -  Active.  Metoprolol Tartrate (50MG  Tablet, Oral) Active. MetFORMIN HCl ER (500MG  Tablet ER 24HR, Oral) Active. Losartan Potassium-HCTZ ( Oral) Specific dose unknown - Active. Furosemide (40MG  Tablet, Oral) Active. Diltiazem HCl ER Coated Beads (120MG  Capsule ER 24HR, Oral) Active. Vitamin D3 ( Oral) Specific dose unknown - Active. Oscal 500/200 D-3 (500-200MG -UNIT Tablet, Oral) Active. Eylea (2MG /0.05ML Solution, Intraocular) Active.   Past Surgical History Hysterectomy. complete (non-cancerous)   Medical History Sleep Apnea. uses CPAP High blood pressure Kidney Stone Osteoarthritis Diabetes Mellitus, Type II Anxiety Disorder Cardiac Arrhythmia Chronic Pain Macular Degeneration Atrial Fibrillation. Paroxsymal   Review of Systems General:Not Present- Chills, Fever, Night Sweats, Fatigue, Weight Gain, Weight Loss and Memory Loss. Skin:Not Present- Hives, Itching, Rash, Eczema and Lesions. HEENT:Present- Tinnitus. Not Present- Headache, Double Vision, Visual Loss, Hearing Loss and Dentures. Respiratory:Not Present- Shortness of breath with exertion, Shortness of breath at rest, Allergies, Coughing up blood and Chronic Cough. Cardiovascular:Not Present- Chest Pain, Racing/skipping heartbeats, Difficulty Breathing Lying Down, Murmur, Swelling and Palpitations. Gastrointestinal:Not Present- Bloody Stool, Heartburn, Abdominal Pain, Vomiting, Nausea, Constipation, Diarrhea, Difficulty Swallowing, Jaundice and Loss of appetitie. Female Genitourinary:Not Present- Blood in Urine, Urinary frequency, Weak urinary stream, Discharge, Flank Pain, Incontinence, Painful Urination, Urgency, Urinary Retention and Urinating at Night. Musculoskeletal:Present- Joint Swelling, Joint Pain and Morning Stiffness. Not Present- Muscle Weakness, Muscle Pain, Back Pain and Spasms. Neurological:Present- Tremor. Not Present- Dizziness, Blackout spells, Paralysis, Difficulty with balance and  Weakness. Psychiatric:Not Present- Insomnia.   Vitals Weight: 215 lb Height: 65 in Weight was reported by patient. Height was reported by patient. Body Surface Area: 2.11 m Body Mass Index: 35.78 kg/m Pulse: 68 (Irregular) Resp.: 12 (Unlabored) BP: 154/82 (Sitting, Left Arm, Standard)    Physical Exam The physical exam findings are as follows:  Note: Patient is a 72 year old female with continued bilateral knee pain.   General Mental Status - Alert, cooperative and good historian. General Appearance- pleasant. Not in acute distress. Orientation- Oriented X3. Build & Nutrition- Well nourished and Well developed.   Head and Neck Head- normocephalic, atraumatic . Neck Global Assessment- supple. no bruit auscultated on the right and no bruit auscultated on the left.   Eye Pupil- Bilateral- Regular and Round. Motion- Bilateral- EOMI.   Chest and Lung Exam Auscultation: Breath sounds:- clear at anterior chest wall and - clear at posterior chest wall. Adventitious sounds:- No Adventitious sounds.   Cardiovascular Auscultation:Rhythm- Regular rate and rhythm. Heart Sounds- S1 WNL and S2 WNL. Murmurs & Other Heart Sounds:Auscultation of the heart reveals - No Murmurs.   Abdomen Inspection:Contour- Generalized moderate distention. Palpation/Percussion:Tenderness- Abdomen is non-tender to palpation. Rigidity (guarding)- Abdomen is soft. Auscultation:Auscultation of the abdomen reveals - Bowel sounds normal.   Female Genitourinary Not done, not pertinent to present illness  Musculoskeletal On exam, she's a well-developed female, alert and oriented, in no apparent distress. Evaluation of her hips reveals normal ROM with no discomfort. Left knee: No effusion. Range about 5-120. Moderate crepitus on ROM. No lateral tenderness. She has some medial tenderness but no instability. Right knee: Range 10-110. Marked crepitus  on ROM with varus deformity. Tenderness medially. No lateral tenderness. No instability noted. Pulses, sensation and motor are intact.  RADIOGRAPHS: AP of both knees and lateral show that she has advanced end stage tricompartmental arthritis of both knees, right worse with left with significant deformity present.  Assessment & Plan Primary osteoarthritis of both knees (715.16) Impression: Right Knee  Note: Plan is for a Right Total Knee Replacement and a Left Knee  Cortisone Injection by Dr. Lequita Halt.  Plan is to go home with family.  PCP - Dr. Marinda Elk Cardiology - Dr. Carolanne Grumbling  Signed electronically by Roberts Gaudy, PA-C

## 2012-07-16 NOTE — Pre-Procedure Instructions (Signed)
PT'S EKG REPORT AND CARDIOLOGY OFFICE NOTES 07/10/12 FROM [facility] ON THIS CHART.

## 2012-07-18 NOTE — Pre-Procedure Instructions (Signed)
UNABLE TO GET REPORT OF PT'S STRESS TEST FROM DR. T. TURNER'S OFFICE TODAY - OFFICE CLOSED BECAUSE OF SNOW.  DR. ALUISIO'S OFFICE STATES NO REPORT RECEIVED YET.  I SPOKE WITH PT BY PHONE-SHE STATES DR. Norris Cross OFFICE CALLED HER AND TOLD HER THE RESULTS WERE "GREAT".  I HAVE FAXED A WRITTEN REQUEST TO DR. Norris Cross OFFICE ASKING THAT REPORT BE FAXED Monday AM AS PT'S SURGERY IS Monday.  I NOTIFIED PT THAT HER SURGERY TIME MOVED TO 11:30 AM AND TO PLEASE ARRIVE TO SHORT STAY BY 8:30 AM ON 07/21/12.

## 2012-07-21 ENCOUNTER — Encounter (HOSPITAL_COMMUNITY): Payer: Self-pay | Admitting: Anesthesiology

## 2012-07-21 ENCOUNTER — Inpatient Hospital Stay (HOSPITAL_COMMUNITY): Payer: Medicare Other | Admitting: Anesthesiology

## 2012-07-21 ENCOUNTER — Inpatient Hospital Stay (HOSPITAL_COMMUNITY)
Admission: RE | Admit: 2012-07-21 | Discharge: 2012-07-23 | DRG: 470 | Disposition: A | Discharge status: Home Health | Payer: Medicare Other | Source: Ambulatory Visit | Attending: Orthopedic Surgery | Admitting: Orthopedic Surgery

## 2012-07-21 ENCOUNTER — Encounter (HOSPITAL_COMMUNITY): Payer: Self-pay | Admitting: *Deleted

## 2012-07-21 ENCOUNTER — Encounter (HOSPITAL_COMMUNITY)
Admission: RE | Disposition: A | Discharge status: Home Health | Payer: Self-pay | Source: Ambulatory Visit | Attending: Orthopedic Surgery

## 2012-07-21 DIAGNOSIS — Z87442 Personal history of urinary calculi: Secondary | ICD-10-CM

## 2012-07-21 DIAGNOSIS — I4891 Unspecified atrial fibrillation: Secondary | ICD-10-CM | POA: Diagnosis present

## 2012-07-21 DIAGNOSIS — F411 Generalized anxiety disorder: Secondary | ICD-10-CM | POA: Diagnosis present

## 2012-07-21 DIAGNOSIS — I10 Essential (primary) hypertension: Secondary | ICD-10-CM | POA: Diagnosis present

## 2012-07-21 DIAGNOSIS — H3530 Unspecified macular degeneration: Secondary | ICD-10-CM | POA: Diagnosis present

## 2012-07-21 DIAGNOSIS — Z96659 Presence of unspecified artificial knee joint: Secondary | ICD-10-CM

## 2012-07-21 DIAGNOSIS — Z79899 Other long term (current) drug therapy: Secondary | ICD-10-CM

## 2012-07-21 DIAGNOSIS — M1710 Unilateral primary osteoarthritis, unspecified knee: Principal | ICD-10-CM | POA: Diagnosis present

## 2012-07-21 DIAGNOSIS — M179 Osteoarthritis of knee, unspecified: Secondary | ICD-10-CM | POA: Diagnosis present

## 2012-07-21 DIAGNOSIS — G4730 Sleep apnea, unspecified: Secondary | ICD-10-CM | POA: Diagnosis present

## 2012-07-21 DIAGNOSIS — E119 Type 2 diabetes mellitus without complications: Secondary | ICD-10-CM | POA: Diagnosis present

## 2012-07-21 DIAGNOSIS — Z880 Allergy status to penicillin: Secondary | ICD-10-CM

## 2012-07-21 HISTORY — PX: TOTAL KNEE ARTHROPLASTY: SHX125

## 2012-07-21 LAB — TYPE AND SCREEN: Antibody Screen: NEGATIVE

## 2012-07-21 LAB — PROTIME-INR: INR: 1.04 (ref 0.00–1.49)

## 2012-07-21 LAB — GLUCOSE, CAPILLARY
Glucose-Capillary: 155 mg/dL — ABNORMAL HIGH (ref 70–99)
Glucose-Capillary: 151 mg/dL — ABNORMAL HIGH (ref 70–99)

## 2012-07-21 SURGERY — ARTHROPLASTY, KNEE, TOTAL
Anesthesia: General | Site: Knee | Laterality: Right | Wound class: Clean

## 2012-07-21 MED ORDER — ENOXAPARIN SODIUM 30 MG/0.3ML ~~LOC~~ SOLN
30.0000 mg | Freq: Two times a day (BID) | SUBCUTANEOUS | Status: DC
  Administered 2012-07-22 – 2012-07-23 (×3): 30 mg via SUBCUTANEOUS
  Filled 2012-07-21 (×5): qty 0.3

## 2012-07-21 MED ORDER — NEOSTIGMINE METHYLSULFATE 1 MG/ML IJ SOLN
INTRAMUSCULAR | Status: DC | PRN
  Administered 2012-07-21: 4 mg via INTRAVENOUS

## 2012-07-21 MED ORDER — PHENOL 1.4 % MT LIQD
1.0000 | OROMUCOSAL | Status: DC | PRN
  Filled 2012-07-21: qty 177

## 2012-07-21 MED ORDER — WARFARIN SODIUM 10 MG PO TABS
10.0000 mg | ORAL_TABLET | Freq: Once | ORAL | Status: AC
  Administered 2012-07-21: 10 mg via ORAL
  Filled 2012-07-21: qty 1

## 2012-07-21 MED ORDER — ACETAMINOPHEN 10 MG/ML IV SOLN: 1000.0000 mg | Freq: Once | INTRAVENOUS | Status: DC

## 2012-07-21 MED ORDER — ROCURONIUM BROMIDE 100 MG/10ML IV SOLN
INTRAVENOUS | Status: DC | PRN
  Administered 2012-07-21: 30 mg via INTRAVENOUS
  Administered 2012-07-21: 10 mg via INTRAVENOUS

## 2012-07-21 MED ORDER — DEXAMETHASONE 6 MG PO TABS
10.0000 mg | ORAL_TABLET | Freq: Once | ORAL | Status: AC
  Administered 2012-07-22: 10 mg via ORAL
  Filled 2012-07-21: qty 1

## 2012-07-21 MED ORDER — DILTIAZEM HCL ER COATED BEADS 120 MG PO CP24
120.0000 mg | ORAL_CAPSULE | Freq: Every morning | ORAL | Status: DC
  Administered 2012-07-23: 120 mg via ORAL
  Filled 2012-07-21 (×2): qty 1

## 2012-07-21 MED ORDER — METHOCARBAMOL 100 MG/ML IJ SOLN
500.0000 mg | Freq: Four times a day (QID) | INTRAVENOUS | Status: DC | PRN
  Administered 2012-07-21: 500 mg via INTRAVENOUS
  Filled 2012-07-21: qty 5

## 2012-07-21 MED ORDER — MENTHOL 3 MG MT LOZG
1.0000 | LOZENGE | OROMUCOSAL | Status: DC | PRN
  Filled 2012-07-21: qty 9

## 2012-07-21 MED ORDER — DOCUSATE SODIUM 100 MG PO CAPS
100.0000 mg | ORAL_CAPSULE | Freq: Two times a day (BID) | ORAL | Status: DC
  Administered 2012-07-21 – 2012-07-23 (×5): 100 mg via ORAL

## 2012-07-21 MED ORDER — METOCLOPRAMIDE HCL 5 MG/ML IJ SOLN: 5.0000 mg | Freq: Three times a day (TID) | INTRAMUSCULAR | Status: DC | PRN

## 2012-07-21 MED ORDER — DEXAMETHASONE SODIUM PHOSPHATE 10 MG/ML IJ SOLN: 10.0000 mg | Freq: Once | INTRAMUSCULAR | Status: AC

## 2012-07-21 MED ORDER — LOSARTAN POTASSIUM-HCTZ 100-12.5 MG PO TABS: 1.0000 | ORAL_TABLET | Freq: Every day | ORAL | Status: DC

## 2012-07-21 MED ORDER — HYDROMORPHONE HCL PF 1 MG/ML IJ SOLN
0.2500 mg | INTRAMUSCULAR | Status: DC | PRN
  Administered 2012-07-21 (×2): 0.5 mg via INTRAVENOUS

## 2012-07-21 MED ORDER — CEFAZOLIN SODIUM-DEXTROSE 2-3 GM-% IV SOLR
2.0000 g | INTRAVENOUS | Status: AC
  Administered 2012-07-21: 2 g via INTRAVENOUS

## 2012-07-21 MED ORDER — HYDROCHLOROTHIAZIDE 12.5 MG PO CAPS
12.5000 mg | ORAL_CAPSULE | Freq: Every day | ORAL | Status: DC
  Administered 2012-07-21 – 2012-07-23 (×2): 12.5 mg via ORAL
  Filled 2012-07-21 (×3): qty 1

## 2012-07-21 MED ORDER — HYDROMORPHONE HCL PF 1 MG/ML IJ SOLN
INTRAMUSCULAR | Status: DC | PRN
  Administered 2012-07-21: 1 mg via INTRAVENOUS

## 2012-07-21 MED ORDER — CHLORHEXIDINE GLUCONATE 4 % EX LIQD: 60.0000 mL | Freq: Once | CUTANEOUS | Status: DC

## 2012-07-21 MED ORDER — BUPIVACAINE LIPOSOME 1.3 % IJ SUSP
20.0000 mL | Freq: Once | INTRAMUSCULAR | Status: AC
  Administered 2012-07-21: 20 mL
  Filled 2012-07-21: qty 20

## 2012-07-21 MED ORDER — OXYCODONE HCL 5 MG/5ML PO SOLN
5.0000 mg | Freq: Once | ORAL | Status: DC | PRN
  Filled 2012-07-21: qty 5

## 2012-07-21 MED ORDER — EPHEDRINE SULFATE 50 MG/ML IJ SOLN
INTRAMUSCULAR | Status: DC | PRN
  Administered 2012-07-21: 5 mg via INTRAVENOUS

## 2012-07-21 MED ORDER — OXYCODONE HCL 5 MG PO TABS
5.0000 mg | ORAL_TABLET | ORAL | Status: DC | PRN
  Administered 2012-07-21: 5 mg via ORAL
  Administered 2012-07-21 – 2012-07-22 (×6): 10 mg via ORAL
  Administered 2012-07-22: 5 mg via ORAL
  Administered 2012-07-23 (×2): 10 mg via ORAL
  Filled 2012-07-21: qty 1
  Filled 2012-07-21 (×4): qty 2
  Filled 2012-07-21: qty 1
  Filled 2012-07-21 (×5): qty 2

## 2012-07-21 MED ORDER — LIDOCAINE HCL (CARDIAC) 20 MG/ML IV SOLN
INTRAVENOUS | Status: DC | PRN
  Administered 2012-07-21: 50 mg via INTRAVENOUS

## 2012-07-21 MED ORDER — TRAMADOL HCL 50 MG PO TABS: 50.0000 mg | ORAL_TABLET | Freq: Four times a day (QID) | ORAL | Status: DC | PRN

## 2012-07-21 MED ORDER — FUROSEMIDE 40 MG PO TABS
40.0000 mg | ORAL_TABLET | Freq: Every morning | ORAL | Status: DC
  Administered 2012-07-21 – 2012-07-23 (×3): 40 mg via ORAL
  Filled 2012-07-21 (×3): qty 1

## 2012-07-21 MED ORDER — METHOCARBAMOL 500 MG PO TABS
500.0000 mg | ORAL_TABLET | Freq: Four times a day (QID) | ORAL | Status: DC | PRN
  Administered 2012-07-22 – 2012-07-23 (×3): 500 mg via ORAL
  Filled 2012-07-21 (×3): qty 1

## 2012-07-21 MED ORDER — METOPROLOL TARTRATE 25 MG PO TABS
25.0000 mg | ORAL_TABLET | Freq: Two times a day (BID) | ORAL | Status: DC
  Administered 2012-07-21 – 2012-07-23 (×4): 25 mg via ORAL
  Filled 2012-07-21 (×5): qty 1

## 2012-07-21 MED ORDER — GLYCOPYRROLATE 0.2 MG/ML IJ SOLN
INTRAMUSCULAR | Status: DC | PRN
  Administered 2012-07-21: .7 mg via INTRAVENOUS

## 2012-07-21 MED ORDER — WARFARIN - PHARMACIST DOSING INPATIENT: Freq: Every day | Status: DC

## 2012-07-21 MED ORDER — POTASSIUM CITRATE ER 10 MEQ (1080 MG) PO TBCR
10.0000 meq | EXTENDED_RELEASE_TABLET | Freq: Three times a day (TID) | ORAL | Status: DC
  Administered 2012-07-21 – 2012-07-23 (×6): 10 meq via ORAL
  Filled 2012-07-21 (×8): qty 1

## 2012-07-21 MED ORDER — ACETAMINOPHEN 10 MG/ML IV SOLN
1000.0000 mg | Freq: Four times a day (QID) | INTRAVENOUS | Status: AC
  Administered 2012-07-21 – 2012-07-22 (×4): 1000 mg via INTRAVENOUS
  Filled 2012-07-21 (×6): qty 100

## 2012-07-21 MED ORDER — BUPIVACAINE ON-Q PAIN PUMP (FOR ORDER SET NO CHG)
INJECTION | Status: DC
  Filled 2012-07-21: qty 1

## 2012-07-21 MED ORDER — PROPOFOL 10 MG/ML IV BOLUS
INTRAVENOUS | Status: DC | PRN
  Administered 2012-07-21: 150 mg via INTRAVENOUS

## 2012-07-21 MED ORDER — LACTATED RINGERS IV SOLN
INTRAVENOUS | Status: DC
  Administered 2012-07-21: 1000 mL via INTRAVENOUS
  Administered 2012-07-21: 12:00:00 via INTRAVENOUS

## 2012-07-21 MED ORDER — INSULIN ASPART 100 UNIT/ML ~~LOC~~ SOLN
0.0000 [IU] | Freq: Three times a day (TID) | SUBCUTANEOUS | Status: DC
  Administered 2012-07-21 – 2012-07-22 (×4): 3 [IU] via SUBCUTANEOUS
  Administered 2012-07-23: 2 [IU] via SUBCUTANEOUS
  Administered 2012-07-23: 3 [IU] via SUBCUTANEOUS

## 2012-07-21 MED ORDER — FENTANYL CITRATE 0.05 MG/ML IJ SOLN
INTRAMUSCULAR | Status: DC | PRN
  Administered 2012-07-21 (×3): 50 ug via INTRAVENOUS
  Administered 2012-07-21: 100 ug via INTRAVENOUS

## 2012-07-21 MED ORDER — METOCLOPRAMIDE HCL 5 MG/ML IJ SOLN: 10.0000 mg | Freq: Once | INTRAMUSCULAR | Status: DC | PRN

## 2012-07-21 MED ORDER — ACETAMINOPHEN 325 MG PO TABS: 650.0000 mg | ORAL_TABLET | Freq: Four times a day (QID) | ORAL | Status: DC | PRN

## 2012-07-21 MED ORDER — METFORMIN HCL 500 MG PO TABS
500.0000 mg | ORAL_TABLET | Freq: Every day | ORAL | Status: DC
  Filled 2012-07-21 (×2): qty 1

## 2012-07-21 MED ORDER — POLYETHYLENE GLYCOL 3350 17 G PO PACK: 17.0000 g | PACK | Freq: Every day | ORAL | Status: DC | PRN

## 2012-07-21 MED ORDER — SODIUM CHLORIDE 0.9 % IJ SOLN
INTRAMUSCULAR | Status: DC | PRN
  Administered 2012-07-21: 50 mL

## 2012-07-21 MED ORDER — ONDANSETRON HCL 4 MG/2ML IJ SOLN
INTRAMUSCULAR | Status: DC | PRN
  Administered 2012-07-21: 4 mg via INTRAVENOUS

## 2012-07-21 MED ORDER — FLEET ENEMA 7-19 GM/118ML RE ENEM: 1.0000 | ENEMA | Freq: Once | RECTAL | Status: AC | PRN

## 2012-07-21 MED ORDER — ONDANSETRON HCL 4 MG/2ML IJ SOLN: 4.0000 mg | Freq: Four times a day (QID) | INTRAMUSCULAR | Status: DC | PRN

## 2012-07-21 MED ORDER — METOCLOPRAMIDE HCL 10 MG PO TABS: 5.0000 mg | ORAL_TABLET | Freq: Three times a day (TID) | ORAL | Status: DC | PRN

## 2012-07-21 MED ORDER — CEFAZOLIN SODIUM-DEXTROSE 2-3 GM-% IV SOLR
2.0000 g | Freq: Four times a day (QID) | INTRAVENOUS | Status: AC
  Administered 2012-07-21 (×2): 2 g via INTRAVENOUS
  Filled 2012-07-21 (×2): qty 50

## 2012-07-21 MED ORDER — DIPHENHYDRAMINE HCL 12.5 MG/5ML PO ELIX: 12.5000 mg | ORAL_SOLUTION | ORAL | Status: DC | PRN

## 2012-07-21 MED ORDER — HYDROMORPHONE HCL PF 1 MG/ML IJ SOLN
INTRAMUSCULAR | Status: AC
  Filled 2012-07-21: qty 1

## 2012-07-21 MED ORDER — SODIUM CHLORIDE 0.9 % IV SOLN
INTRAVENOUS | Status: DC
  Administered 2012-07-21 – 2012-07-22 (×3): via INTRAVENOUS

## 2012-07-21 MED ORDER — LOSARTAN POTASSIUM 50 MG PO TABS
100.0000 mg | ORAL_TABLET | Freq: Every day | ORAL | Status: DC
  Administered 2012-07-21 – 2012-07-23 (×2): 100 mg via ORAL
  Filled 2012-07-21 (×3): qty 2

## 2012-07-21 MED ORDER — 0.9 % SODIUM CHLORIDE (POUR BTL) OPTIME
TOPICAL | Status: DC | PRN
  Administered 2012-07-21: 1000 mL

## 2012-07-21 MED ORDER — POTASSIUM CHLORIDE CRYS ER 10 MEQ PO TBCR
10.0000 meq | EXTENDED_RELEASE_TABLET | Freq: Three times a day (TID) | ORAL | Status: DC
  Administered 2012-07-21 – 2012-07-23 (×6): 10 meq via ORAL
  Filled 2012-07-21 (×8): qty 1

## 2012-07-21 MED ORDER — ONDANSETRON HCL 4 MG PO TABS: 4.0000 mg | ORAL_TABLET | Freq: Four times a day (QID) | ORAL | Status: DC | PRN

## 2012-07-21 MED ORDER — SUCCINYLCHOLINE CHLORIDE 20 MG/ML IJ SOLN
INTRAMUSCULAR | Status: DC | PRN
  Administered 2012-07-21: 100 mg via INTRAVENOUS

## 2012-07-21 MED ORDER — SODIUM CHLORIDE 0.9 % IR SOLN
Status: DC | PRN
  Administered 2012-07-21: 1000 mL

## 2012-07-21 MED ORDER — ACETAMINOPHEN 650 MG RE SUPP: 650.0000 mg | Freq: Four times a day (QID) | RECTAL | Status: DC | PRN

## 2012-07-21 MED ORDER — OXYCODONE HCL 5 MG PO TABS: 5.0000 mg | ORAL_TABLET | Freq: Once | ORAL | Status: DC | PRN

## 2012-07-21 MED ORDER — BISACODYL 10 MG RE SUPP: 10.0000 mg | Freq: Every day | RECTAL | Status: DC | PRN

## 2012-07-21 MED ORDER — SODIUM CHLORIDE 0.9 % IV SOLN: INTRAVENOUS | Status: DC

## 2012-07-21 MED ORDER — MORPHINE SULFATE 2 MG/ML IJ SOLN
1.0000 mg | INTRAMUSCULAR | Status: DC | PRN
  Administered 2012-07-21: 1 mg via INTRAVENOUS
  Administered 2012-07-21: 2 mg via INTRAVENOUS
  Filled 2012-07-21 (×2): qty 1

## 2012-07-21 SURGICAL SUPPLY — 51 items
BAG ZIPLOCK 12X15 (MISCELLANEOUS) ×2 IMPLANT
BANDAGE ELASTIC 6 VELCRO ST LF (GAUZE/BANDAGES/DRESSINGS) ×2 IMPLANT
BANDAGE ESMARK 6X9 LF (GAUZE/BANDAGES/DRESSINGS) ×1 IMPLANT
BLADE SAG 18X100X1.27 (BLADE) ×2 IMPLANT
BLADE SAW SGTL 11.0X1.19X90.0M (BLADE) ×2 IMPLANT
BNDG ESMARK 6X9 LF (GAUZE/BANDAGES/DRESSINGS) ×2
BOWL SMART MIX CTS (DISPOSABLE) ×2 IMPLANT
CEMENT HV SMART SET (Cement) ×4 IMPLANT
CLOTH BEACON ORANGE TIMEOUT ST (SAFETY) ×2 IMPLANT
CUFF TOURN SGL QUICK 34 (TOURNIQUET CUFF) ×1
CUFF TRNQT CYL 34X4X40X1 (TOURNIQUET CUFF) ×1 IMPLANT
DECANTER SPIKE VIAL GLASS SM (MISCELLANEOUS) ×2 IMPLANT
DRAPE EXTREMITY T 121X128X90 (DRAPE) ×2 IMPLANT
DRAPE POUCH INSTRU U-SHP 10X18 (DRAPES) ×2 IMPLANT
DRAPE U-SHAPE 47X51 STRL (DRAPES) ×2 IMPLANT
DRSG ADAPTIC 3X8 NADH LF (GAUZE/BANDAGES/DRESSINGS) ×2 IMPLANT
DRSG PAD ABDOMINAL 8X10 ST (GAUZE/BANDAGES/DRESSINGS) ×2 IMPLANT
DURAPREP 26ML APPLICATOR (WOUND CARE) ×2 IMPLANT
ELECT REM PT RETURN 9FT ADLT (ELECTROSURGICAL) ×2
ELECTRODE REM PT RTRN 9FT ADLT (ELECTROSURGICAL) ×1 IMPLANT
EVACUATOR 1/8 PVC DRAIN (DRAIN) ×2 IMPLANT
FACESHIELD LNG OPTICON STERILE (SAFETY) ×10 IMPLANT
GLOVE BIO SURGEON STRL SZ8 (GLOVE) ×2 IMPLANT
GLOVE BIOGEL PI IND STRL 8 (GLOVE) ×1 IMPLANT
GLOVE BIOGEL PI INDICATOR 8 (GLOVE) ×1
GLOVE SURG SS PI 6.5 STRL IVOR (GLOVE) ×4 IMPLANT
GOWN STRL NON-REIN LRG LVL3 (GOWN DISPOSABLE) ×8 IMPLANT
HANDPIECE INTERPULSE COAX TIP (DISPOSABLE) ×1
IMMOBILIZER KNEE 20 (SOFTGOODS) ×2
IMMOBILIZER KNEE 20 THIGH 36 (SOFTGOODS) ×1 IMPLANT
KIT BASIN OR (CUSTOM PROCEDURE TRAY) ×2 IMPLANT
MANIFOLD NEPTUNE II (INSTRUMENTS) ×2 IMPLANT
NDL SAFETY ECLIPSE 18X1.5 (NEEDLE) ×1 IMPLANT
NEEDLE HYPO 18GX1.5 SHARP (NEEDLE) ×1
NS IRRIG 1000ML POUR BTL (IV SOLUTION) ×2 IMPLANT
PACK TOTAL JOINT (CUSTOM PROCEDURE TRAY) ×2 IMPLANT
PADDING CAST COTTON 6X4 STRL (CAST SUPPLIES) ×2 IMPLANT
POSITIONER SURGICAL ARM (MISCELLANEOUS) ×2 IMPLANT
SET HNDPC FAN SPRY TIP SCT (DISPOSABLE) ×1 IMPLANT
SPONGE GAUZE 4X4 12PLY (GAUZE/BANDAGES/DRESSINGS) ×2 IMPLANT
STRIP CLOSURE SKIN 1/2X4 (GAUZE/BANDAGES/DRESSINGS) ×4 IMPLANT
SUCTION FRAZIER 12FR DISP (SUCTIONS) ×2 IMPLANT
SUT MNCRL AB 4-0 PS2 18 (SUTURE) ×2 IMPLANT
SUT VIC AB 2-0 CT1 27 (SUTURE) ×3
SUT VIC AB 2-0 CT1 TAPERPNT 27 (SUTURE) ×3 IMPLANT
SUT VLOC 180 0 24IN GS25 (SUTURE) ×2 IMPLANT
SYR 50ML LL SCALE MARK (SYRINGE) ×2 IMPLANT
TOWEL OR 17X26 10 PK STRL BLUE (TOWEL DISPOSABLE) ×4 IMPLANT
TRAY FOLEY CATH 14FRSI W/METER (CATHETERS) ×2 IMPLANT
WATER STERILE IRR 1500ML POUR (IV SOLUTION) ×4 IMPLANT
WRAP KNEE MAXI GEL POST OP (GAUZE/BANDAGES/DRESSINGS) ×2 IMPLANT

## 2012-07-21 NOTE — Transfer of Care (Signed)
Immediate Anesthesia Transfer of Care Note  Patient: [name]  Procedure(s) Performed: Procedure(s) (LRB): TOTAL KNEE ARTHROPLASTY (Right)  Patient Location: PACU  Anesthesia Type: General  Level of Consciousness: sedated, patient cooperative and responds to stimulaton  Airway & Oxygen Therapy: Patient Spontanous Breathing and Patient connected to face mask oxgen  Post-op Assessment: Report given to PACU RN and Post -op Vital signs reviewed and stable  Post vital signs: Reviewed and stable  Complications: No apparent anesthesia complications

## 2012-07-21 NOTE — Interval H&P Note (Signed)
History and Physical Interval Note:  07/21/2012 10:40 AM  [name]  has presented today for surgery, with the diagnosis of OA OF RIGHT KNEE  The various methods of treatment have been discussed with the patient and family. After consideration of risks, benefits and other options for treatment, the patient has consented to  Procedure(s): TOTAL KNEE ARTHROPLASTY (Right) as a surgical intervention .  The patient's history has been reviewed, patient examined, no change in status, stable for surgery.  I have reviewed the patient's chart and labs.  Questions were answered to the patient's satisfaction.     Loanne Drilling

## 2012-07-21 NOTE — Progress Notes (Signed)
Utilization review completed.  

## 2012-07-21 NOTE — Plan of Care (Signed)
Problem: Consults Goal: Diagnosis- Total Joint Replacement Primary Total Knee     

## 2012-07-21 NOTE — Progress Notes (Signed)
Placed pt on cpap for rest.  RN notified.  Settings autotitration mode 5-20cm h2o with 2l o2 bleedin.  Pt is wearing her nasal mask and tubing from home and tolerating well at this time.  HR 94, sats 98%.  Sterile water added to max fill line of humidity chamber.

## 2012-07-21 NOTE — H&P (View-Only) (Signed)
[name]  DOB: 10/28/1940 Divorced / Language: English / Race: White Female  Date of Admission:  07/21/2012  Chief  Complaint:  Right Knee Pain  History of Present Illness The patient is a 72 year old female who comes in for a preoperative History and Physical. The patient is scheduled for a right total knee arthroplasty to be performed by Dr. Frank V. Aluisio, MD at [facility] on 07/21/2012. The patient is a 72 year old female who presents with knee complaints. The patient was seen for a second opinion. The patient reports right knee (worse than left) symptoms including: pain, instability, giving way, weakness and stiffness which began year(s) ago without any known injury. The patient describes the severity of the symptoms as moderate in severity.The patient feels that the symptoms are worsening. The patient has the current diagnosis of knee osteoarthritis. Prior to being seen today the patient was previously evaluated by a colleague. Previous work-up for this problem has included knee x-rays. Past treatment for this problem has included intra-articular injection of corticosteroids (as well as Synvisc-One in the right knee) and nonsteroidal anti-inflammatory drugs (Celebrex, stopped due to stomach upset). Current treatment includes opioid analgesics (ultracet). She says she has significant pain and dysfunction in the right worse than left knee. Both knees are bothering her, but the right is worse. She said she was told by her primary doctor that she should wait until she can't walk anymore before she has anything done. She's at that stage now where she can barely even move. Dr. Nitka has treated her in the past with injections but she wanted to come to me for consideration of surgical treatment. She says the injections were no longer beneficial. She's at a stage where she is hurting all the time. It's limiting what she can and cannot do. She used to be very active but  is no longer active due to the pain. she is ready to proceed with her knee surgery. They have been treated conservatively in the past for the above stated problem and despite conservative measures, they continue to have progressive pain and severe functional limitations and dysfunction. They have failed non-operative management including home exercise, medications, and injections. It is felt that they would benefit from undergoing total joint replacement. Risks and benefits of the procedure have been discussed with the patient and they elect to proceed with surgery. There are no active contraindications to surgery such as ongoing infection or rapidly progressive neurological disease.   Problem List Primary osteoarthritis of both knees (715.16)   Allergies Penicillins. Rash, Itching.   Family History Heart Disease. mother, father and grandfather fathers side Heart disease in female family member before age 55 Hypertension. mother Diabetes Mellitus. father Cancer. father Congestive Heart Failure. mother, father and grandfather fathers side Father. Deceased, Congestive Heart Failure. age 87 Mother. Deceased, Congestive Heart Failure. age 90   Social History Drug/Alcohol Rehab (Currently). no Drug/Alcohol Rehab (Previously). no Illicit drug use. no Current work status. retired Alcohol use. never consumed alcohol Children. 2 Tobacco use. never smoker Marital status. divorced Pain Contract. no Tobacco / smoke exposure. no Post-Surgical Plans. Plan is to go home with family. Advance Directives. Living Will, Healthcare POA   Medication History Tramadol-Acetaminophen (37.5-325MG Tablet, Oral) Active. Citalopram Hydrobromide (10MG Tablet, Oral) Active. Warfarin Sodium (5MG Tablet, Oral) Active. Vitamin B1 ( Oral) Specific dose unknown - Active. Potassium Citrate ER (10 MEQ(1080 MG) Tablet ER, Oral) Active. Omega 3 ( Oral) Specific dose unknown -  Active.   Metoprolol Tartrate (50MG Tablet, Oral) Active. MetFORMIN HCl ER (500MG Tablet ER 24HR, Oral) Active. Losartan Potassium-HCTZ ( Oral) Specific dose unknown - Active. Furosemide (40MG Tablet, Oral) Active. Diltiazem HCl ER Coated Beads (120MG Capsule ER 24HR, Oral) Active. Vitamin D3 ( Oral) Specific dose unknown - Active. Oscal 500/200 D-3 (500-200MG-UNIT Tablet, Oral) Active. Eylea (2MG/0.05ML Solution, Intraocular) Active.   Past Surgical History Hysterectomy. complete (non-cancerous)   Medical History Sleep Apnea. uses CPAP High blood pressure Kidney Stone Osteoarthritis Diabetes Mellitus, Type II Anxiety Disorder Cardiac Arrhythmia Chronic Pain Macular Degeneration Atrial Fibrillation. Paroxsymal   Review of Systems General:Not Present- Chills, Fever, Night Sweats, Fatigue, Weight Gain, Weight Loss and Memory Loss. Skin:Not Present- Hives, Itching, Rash, Eczema and Lesions. HEENT:Present- Tinnitus. Not Present- Headache, Double Vision, Visual Loss, Hearing Loss and Dentures. Respiratory:Not Present- Shortness of breath with exertion, Shortness of breath at rest, Allergies, Coughing up blood and Chronic Cough. Cardiovascular:Not Present- Chest Pain, Racing/skipping heartbeats, Difficulty Breathing Lying Down, Murmur, Swelling and Palpitations. Gastrointestinal:Not Present- Bloody Stool, Heartburn, Abdominal Pain, Vomiting, Nausea, Constipation, Diarrhea, Difficulty Swallowing, Jaundice and Loss of appetitie. Female Genitourinary:Not Present- Blood in Urine, Urinary frequency, Weak urinary stream, Discharge, Flank Pain, Incontinence, Painful Urination, Urgency, Urinary Retention and Urinating at Night. Musculoskeletal:Present- Joint Swelling, Joint Pain and Morning Stiffness. Not Present- Muscle Weakness, Muscle Pain, Back Pain and Spasms. Neurological:Present- Tremor. Not Present- Dizziness, Blackout spells, Paralysis, Difficulty with balance and  Weakness. Psychiatric:Not Present- Insomnia.   Vitals Weight: 215 lb Height: 65 in Weight was reported by patient. Height was reported by patient. Body Surface Area: 2.11 m Body Mass Index: 35.78 kg/m Pulse: 68 (Irregular) Resp.: 12 (Unlabored) BP: 154/82 (Sitting, Left Arm, Standard)    Physical Exam The physical exam findings are as follows:  Note: Patient is a 71 year old female with continued bilateral knee pain.   General Mental Status - Alert, cooperative and good historian. General Appearance- pleasant. Not in acute distress. Orientation- Oriented X3. Build & Nutrition- Well nourished and Well developed.   Head and Neck Head- normocephalic, atraumatic . Neck Global Assessment- supple. no bruit auscultated on the right and no bruit auscultated on the left.   Eye Pupil- Bilateral- Regular and Round. Motion- Bilateral- EOMI.   Chest and Lung Exam Auscultation: Breath sounds:- clear at anterior chest wall and - clear at posterior chest wall. Adventitious sounds:- No Adventitious sounds.   Cardiovascular Auscultation:Rhythm- Regular rate and rhythm. Heart Sounds- S1 WNL and S2 WNL. Murmurs & Other Heart Sounds:Auscultation of the heart reveals - No Murmurs.   Abdomen Inspection:Contour- Generalized moderate distention. Palpation/Percussion:Tenderness- Abdomen is non-tender to palpation. Rigidity (guarding)- Abdomen is soft. Auscultation:Auscultation of the abdomen reveals - Bowel sounds normal.   Female Genitourinary Not done, not pertinent to present illness  Musculoskeletal On exam, she's a well-developed female, alert and oriented, in no apparent distress. Evaluation of her hips reveals normal ROM with no discomfort. Left knee: No effusion. Range about 5-120. Moderate crepitus on ROM. No lateral tenderness. She has some medial tenderness but no instability. Right knee: Range 10-110. Marked crepitus  on ROM with varus deformity. Tenderness medially. No lateral tenderness. No instability noted. Pulses, sensation and motor are intact.  RADIOGRAPHS: AP of both knees and lateral show that she has advanced end stage tricompartmental arthritis of both knees, right worse with left with significant deformity present.  Assessment & Plan Primary osteoarthritis of both knees (715.16) Impression: Right Knee  Note: Plan is for a Right Total Knee Replacement and a Left Knee   Cortisone Injection by Dr. Aluisio.  Plan is to go home with family.  PCP - Dr. Robert Fried Cardiology - Dr. Tracy Turner  Signed electronically by DREW L Linzie Boursiquot, PA-C  

## 2012-07-21 NOTE — Progress Notes (Signed)
ANTICOAGULATION CONSULT NOTE - Initial Consult  Pharmacy Consult for Warfarin Indication: VTE Prophylaxis  Allergies  Allergen Reactions  . Penicillins Rash    Patient Measurements:   07/14/12 wt = 97.5kg  Vital Signs: Temp: 97.5 F (36.4 C) (02/17 1430) BP: 130/79 mmHg (02/17 1430) Pulse Rate: 75 (02/17 1430)  Labs:  Recent Labs  07/21/12 1010  LABPROT 13.5  INR 1.04    The CrCl is unknown because both a height and weight (above a minimum accepted value) are required for this calculation.   Medical History: Past Medical History  Diagnosis Date  . Arthritis   . Hypertension   . Anticoagulated on warfarin CHRONIC    HX PAF  . Osteoarthritis of knee BILATERAL  . Ankle edema LEFT  . Ureteral stone LEFT  . Macular degeneration of both eyes     RECEIVES AVASTIN INJECTION TO LEFT EYE EVERY 6-8 WKS  . Anxiety   . Atrial fibrillation CARDIOLOGIST- DR Gloris Manchester TURNER LAST VISIT 02-20-11 (WILL REQUEST NOTE, STRESS TEST AND ECHO)    UNSUCCESSFUL CARDIOVERSION IN 2010--- CHRONIC COUMADIN THERAPY  . Thyroid dysfunction     SIDE EFFECT OF AMIODARONE--PT STATES LAST BLOOD TEST SHOWED THYROID LEVELS NORMAL  . Sleep apnea     SETTING IS 4 ON CPAP  . Diabetes mellitus     ORAL MED TO "PREVENT DIABETES"--PT DOES NOT HAVE TO CHECK HER SUGARS    Anticoag or Interacting Medications:  Lovenox 30mg  SQ q12h (start 2/18)  Assessment: 72 yo F s/p R TKA. Pt has a hx of Afib, on warfarin prior to admission. Home dose reported as warfarin 7.5mg  on MWF, and 5mg  on TuThSS. Current INR is 1.04. Order now to resume warfarin post-op. Will give ~1.5x usual home dose tonight per protocol.  Goal of Therapy:  INR 2-3   Plan:  1.  Warfarin 10mg  PO x1 at 20:00 tonight 2.  Daily PT/INR  Darrol Angel, PharmD Pager: [phone] 07/21/2012,3:23 PM

## 2012-07-21 NOTE — Op Note (Signed)
Pre-operative diagnosis- Osteoarthritis  Right knee(s)  Post-operative diagnosis- Osteoarthritis Right knee(s)  Procedure-  Right  Total Knee Arthroplasty  Surgeon- Shelia Rankin. Klynn Linnemann, MD  Assistant- [name], PA-C   Anesthesia-  General EBL-* No blood loss amount entered *  Drains Hemovac  Tourniquet time-  Total Tourniquet Time Documented: Thigh (Right) - 37 minutes Total: Thigh (Right) - 37 minutes    Complications- None  Condition-PACU - hemodynamically stable.   Brief Clinical Note  [name] is a 72 y.o. year old female with end stage OA of her right knee with progressively worsening pain and dysfunction. She has constant pain, with activity and at rest and significant functional deficits with difficulties even with ADLs. She has had extensive non-op management including analgesics, injections of cortisone and viscosupplements, and home exercise program, but remains in significant pain with significant dysfunction.Radiographs show bone on bone arthritis medial and patellofemoral with large varus deformity. She presents now for right Total Knee Arthroplasty.    Procedure in detail---   The patient is brought into the operating room and positioned supine on the operating table. After successful administration of  General   a tourniquet is placed high on the  Right thigh(s) and the lower extremity is prepped and draped in the usual sterile fashion. Time out is performed by the operating team and then the  Right lower extremity is wrapped in Esmarch, knee flexed and the tourniquet inflated to 300 mmHg.       A midline incision is made with a ten blade through the subcutaneous tissue to the level of the extensor mechanism. A fresh blade is used to make a medial parapatellar arthrotomy. Soft tissue over the proximal medial tibia is subperiosteally elevated to the joint line with a knife and into the semimembranosus bursa with a Cobb elevator. Soft tissue over the proximal lateral  tibia is elevated with attention being paid to avoiding the patellar tendon on the tibial tubercle. The patella is everted, knee flexed 90 degrees and the ACL and PCL are removed. Findings are bone on bone all 3 compartments with varus deformity and large global osteophytes.        The drill is used to create a starting hole in the distal femur and the canal is thoroughly irrigated with sterile saline to remove the fatty contents. The 5 degree Right  valgus alignment guide is placed into the femoral canal and the distal femoral cutting block is pinned to remove 10 mm off the distal femur. Resection is made with an oscillating saw.      The tibia is subluxed forward and the menisci are removed. The extramedullary alignment guide is placed referencing proximally at the medial aspect of the tibial tubercle and distally along the second metatarsal axis and tibial crest. The block is pinned to remove 2mm off the more deficient medial  side. Resection is made with an oscillating saw. Size 3is the most appropriate size for the tibia and the proximal tibia is prepared with the modular drill and keel punch for that size.      The femoral sizing guide is placed and size 3 is most appropriate. Rotation is marked off the epicondylar axis and confirmed by creating a rectangular flexion gap at 90 degrees. The size 3 cutting block is pinned in this rotation and the anterior, posterior and chamfer cuts are made with the oscillating saw. The intercondylar block is then placed and that cut is made.      Trial size  3 tibial component, trial size 3 posterior stabilized femur and a 12.5  mm posterior stabilized rotating platform insert trial is placed. Full extension is achieved with excellent varus/valgus and anterior/posterior balance throughout full range of motion. The patella is everted and thickness measured to be 22  mm. Free hand resection is taken to 12 mm, a 38 template is placed, lug holes are drilled, trial patella is  placed, and it tracks normally. Osteophytes are removed off the posterior femur with the trial in place. All trials are removed and the cut bone surfaces prepared with pulsatile lavage. Cement is mixed and once ready for implantation, the size 3 tibial implant, size  3 posterior stabilized femoral component, and the size 38 patella are cemented in place and the patella is held with the clamp. The trial insert is placed and the knee held in full extension. The Exparel (20 ml mixed with 50 ml saline) is injected into the extensor mechanism, posterior capsule, medial and lateral gutters and subcutaneous tissues.  All extruded cement is removed and once the cement is hard the permanent 12.5 mm posterior stabilized rotating platform insert is placed into the tibial tray.      The wound is copiously irrigated with saline solution and the extensor mechanism closed over a hemovac drain with #1 PDS suture. The tourniquet is released for a total tourniquet time of 37  minutes. Flexion against gravity is 140 degrees and the patella tracks normally. Subcutaneous tissue is closed with 2.0 vicryl and subcuticular with running 4.0 Monocryl. The incision is cleaned and dried and steri-strips and a bulky sterile dressing are applied. The limb is placed into a knee immobilizer and the patient is awakened and transported to recovery in stable condition.      Please note that a surgical assistant was a medical necessity for this procedure in order to perform it in a safe and expeditious manner. Surgical assistant was necessary to retract the ligaments and vital neurovascular structures to prevent injury to them and also necessary for proper positioning of the limb to allow for anatomic placement of the prosthesis.   Shelia Rankin Jermeka Schlotterbeck, MD    07/21/2012, 12:47 PM

## 2012-07-21 NOTE — Anesthesia Preprocedure Evaluation (Signed)
Anesthesia Evaluation  Patient identified by MRN, date of birth, ID band Patient awake    Reviewed: Allergy & Precautions, H&P , NPO status , Patient's Chart, lab work & pertinent test results, reviewed documented beta blocker date and time   Airway Mallampati: II TM Distance: >3 FB Neck ROM: full    Dental   Pulmonary sleep apnea ,  breath sounds clear to auscultation        Cardiovascular hypertension, On Medications and On Home Beta Blockers + dysrhythmias Atrial Fibrillation Rhythm:regular     Neuro/Psych negative neurological ROS  negative psych ROS   GI/Hepatic negative GI ROS, Neg liver ROS,   Endo/Other  diabetes, Oral Hypoglycemic Agents  Renal/GU negative Renal ROS  negative genitourinary   Musculoskeletal   Abdominal   Peds  Hematology negative hematology ROS (+)   Anesthesia Other Findings See surgeon's H&P   Reproductive/Obstetrics negative OB ROS                           Anesthesia Physical Anesthesia Plan  ASA: III  Anesthesia Plan: General   Post-op Pain Management:    Induction: Intravenous  Airway Management Planned: Oral ETT  Additional Equipment:   Intra-op Plan:   Post-operative Plan: Extubation in OR  Informed Consent: I have reviewed the patients History and Physical, chart, labs and discussed the procedure including the risks, benefits and alternatives for the proposed anesthesia with the patient or authorized representative who has indicated his/her understanding and acceptance.   Dental Advisory Given  Plan Discussed with: CRNA and Surgeon  Anesthesia Plan Comments:         Anesthesia Quick Evaluation

## 2012-07-21 NOTE — Anesthesia Postprocedure Evaluation (Signed)
Anesthesia Post Note  Patient: [name]  Procedure(s) Performed: Procedure(s) (LRB): TOTAL KNEE ARTHROPLASTY (Right)  Anesthesia type: general  Patient location: PACU  Post pain: Pain level controlled  Post assessment: Patient's Cardiovascular Status Stable  Last Vitals:  Filed Vitals:   07/21/12 1430  BP: 130/79  Pulse: 75  Temp: 36.4 C  Resp: 16    Post vital signs: Reviewed and stable  Level of consciousness: sedated  Complications: No apparent anesthesia complications

## 2012-07-22 ENCOUNTER — Encounter (HOSPITAL_COMMUNITY): Payer: Self-pay | Admitting: Orthopedic Surgery

## 2012-07-22 LAB — CBC
HCT: 32.4 % — ABNORMAL LOW (ref 36.0–46.0)
MCHC: 34.9 g/dL (ref 30.0–36.0)
Platelets: 242 10*3/uL (ref 150–400)
RDW: 12.5 % (ref 11.5–15.5)
WBC: 10.8 10*3/uL — ABNORMAL HIGH (ref 4.0–10.5)

## 2012-07-22 LAB — BASIC METABOLIC PANEL
BUN: 8 mg/dL (ref 6–23)
Chloride: 95 mEq/L — ABNORMAL LOW (ref 96–112)
GFR calc Af Amer: >90 mL/min (ref >90)
GFR calc non Af Amer: >90 mL/min (ref >90)
Potassium: 3.5 mEq/L (ref 3.5–5.1)

## 2012-07-22 LAB — PROTIME-INR: Prothrombin Time: 13.6 seconds (ref 11.6–15.2)

## 2012-07-22 LAB — GLUCOSE, CAPILLARY: Glucose-Capillary: 159 mg/dL — ABNORMAL HIGH (ref 70–99)

## 2012-07-22 MED ORDER — WARFARIN SODIUM 10 MG PO TABS
10.0000 mg | ORAL_TABLET | Freq: Once | ORAL | Status: AC
  Administered 2012-07-22: 10 mg via ORAL
  Filled 2012-07-22: qty 1

## 2012-07-22 NOTE — Evaluation (Signed)
Physical Therapy Evaluation Patient Details Name: [name] MRN: [id number] DOB: July 24, 1940 Today's Date: 07/22/2012 Time: 0812-0855 PT Time Calculation (min): 43 min  PT Assessment / Plan / Recommendation Clinical Impression  Pt s/p R TKR presents with decreased R LE strength/ROM and post op discomfort limiting functional mobility    PT Assessment  Patient needs continued PT services    Follow Up Recommendations  Home health PT    Does the patient have the potential to tolerate intense rehabilitation      Barriers to Discharge None      Equipment Recommendations  Rolling walker with 5" wheels    Recommendations for Other Services OT consult   Frequency 7X/week    Precautions / Restrictions Precautions Precautions: Knee;Fall Required Braces or Orthoses: Knee Immobilizer - Right Knee Immobilizer - Right: Discontinue once straight leg raise with < 10 degree lag Restrictions Weight Bearing Restrictions: No Other Position/Activity Restrictions: WBAT   Pertinent Vitals/Pain Min c/o pain; premed, ice packs provided      Mobility  Bed Mobility Bed Mobility: Supine to Sit Supine to Sit: 4: Min assist Details for Bed Mobility Assistance: cues for sequence and use of L LE to self assist Transfers Transfers: Sit to Stand;Stand to Sit Sit to Stand: 3: Mod assist Stand to Sit: 4: Min assist;3: Mod assist Details for Transfer Assistance: cues for LE management and use of UEs to self assist Ambulation/Gait Ambulation/Gait Assistance: 4: Min assist Ambulation Distance (Feet): 24 Feet Assistive device: Rolling walker Ambulation/Gait Assistance Details: cues for stride length, posture, ER on R, position from RW and sequence Gait Pattern: Step-to pattern    Exercises Total Joint Exercises Ankle Circles/Pumps: AROM;15 reps;Supine;Both Quad Sets: AROM;Both;10 reps;Supine Heel Slides: AAROM;10 reps;Supine;Right Straight Leg Raises: AAROM;Right;10 reps;Supine   PT  Diagnosis: Difficulty walking  PT Problem List: Decreased activity tolerance;Decreased range of motion;Decreased strength;Decreased mobility;Decreased knowledge of use of DME;Pain PT Treatment Interventions: DME instruction;Gait training;Stair training;Functional mobility training;Therapeutic activities;Therapeutic exercise;Patient/family education   PT Goals Acute Rehab PT Goals PT Goal Formulation: With patient Time For Goal Achievement: 07/26/12 Potential to Achieve Goals: Good Pt will go Supine/Side to Sit: with supervision PT Goal: Supine/Side to Sit - Progress: Goal set today Pt will go Sit to Supine/Side: with supervision PT Goal: Sit to Supine/Side - Progress: Goal set today Pt will go Sit to Stand: with supervision PT Goal: Sit to Stand - Progress: Goal set today Pt will go Stand to Sit: with supervision PT Goal: Stand to Sit - Progress: Goal set today Pt will Ambulate: 51 - 150 feet;with supervision;with rolling walker PT Goal: Ambulate - Progress: Goal set today Pt will Go Up / Down Stairs: 3-5 stairs;with min assist;with least restrictive assistive device PT Goal: Up/Down Stairs - Progress: Goal set today  Visit Information  Last PT Received On: 07/22/12 Assistance Needed: +1    Subjective Data  Subjective: I could hardly walk before surgery Patient Stated Goal: Get out in my yard   Prior Functioning  Home Living Lives With: Daughter Available Help at Discharge: Family Type of Home: House Home Access: Stairs to enter Secretary/administrator of Steps: 4 Entrance Stairs-Rails: Right;Left Home Layout: One level Home Adaptive Equipment: Wheelchair - manual;Straight cane Prior Function Level of Independence: Independent;Independent with assistive device(s) Able to Take Stairs?: Yes Vocation: Retired Musician: No difficulties Dominant Hand: Right    Cognition  Cognition Overall Cognitive Status: Appears within functional limits for tasks  assessed/performed Arousal/Alertness: Awake/alert Orientation Level: Appears intact for tasks assessed  Behavior During Session: [facility] for tasks performed    Extremity/Trunk Assessment Right Upper Extremity Assessment RUE ROM/Strength/Tone: [facility] for tasks assessed Left Upper Extremity Assessment LUE ROM/Strength/Tone: [facility] for tasks assessed Right Lower Extremity Assessment RLE ROM/Strength/Tone: Deficits RLE ROM/Strength/Tone Deficits: Quada 2+/5 with AAROM at knee -10 - 40 Left Lower Extremity Assessment LLE ROM/Strength/Tone: [facility] for tasks assessed   Balance    End of Session    GP     [name] 07/22/2012, 9:08 AM

## 2012-07-22 NOTE — Progress Notes (Signed)
Physical Therapy Treatment Patient Details Name: [name] MRN: [id number] DOB: 17-Jun-1940 Today's Date: 07/22/2012 Time: 4782-9562 PT Time Calculation (min): 26 min  PT Assessment / Plan / Recommendation Comments on Treatment Session       Follow Up Recommendations  Home health PT     Does the patient have the potential to tolerate intense rehabilitation     Barriers to Discharge        Equipment Recommendations  Rolling walker with 5" wheels    Recommendations for Other Services OT consult  Frequency 7X/week   Plan Discharge plan remains appropriate    Precautions / Restrictions Precautions Precautions: Knee;Fall Required Braces or Orthoses: Knee Immobilizer - Right Knee Immobilizer - Right: Discontinue once straight leg raise with < 10 degree lag Restrictions Weight Bearing Restrictions: No Other Position/Activity Restrictions: WBAT   Pertinent Vitals/Pain 4/10; premed    Mobility  Bed Mobility Bed Mobility: Sit to Supine Sit to Supine: 4: Min assist Details for Bed Mobility Assistance: cues for sequence and use of L LE to self assist Transfers Transfers: Sit to Stand;Stand to Sit Sit to Stand: 4: Min assist;With upper extremity assist;3: Mod assist;From chair/3-in-1 Stand to Sit: 4: Min assist;With upper extremity assist;With armrests;To chair/3-in-1 Details for Transfer Assistance: mod cues for LE management and use of UEs to self assist. Ambulation/Gait Ambulation/Gait Assistance: 4: Min assist Ambulation Distance (Feet): 103 Feet Assistive device: Rolling walker Ambulation/Gait Assistance Details: cues for posture, sequence, stride length, position from RW and ER on R Gait Pattern: Step-to pattern    Exercises     PT Diagnosis:    PT Problem List:   PT Treatment Interventions:     PT Goals Acute Rehab PT Goals PT Goal Formulation: With patient Time For Goal Achievement: 07/26/12 Potential to Achieve Goals: Good Pt will go Supine/Side to Sit:  with supervision PT Goal: Supine/Side to Sit - Progress: Progressing toward goal Pt will go Sit to Supine/Side: with supervision PT Goal: Sit to Supine/Side - Progress: Progressing toward goal Pt will go Sit to Stand: with supervision PT Goal: Sit to Stand - Progress: Progressing toward goal Pt will go Stand to Sit: with supervision PT Goal: Stand to Sit - Progress: Progressing toward goal Pt will Ambulate: 51 - 150 feet;with supervision;with rolling walker PT Goal: Ambulate - Progress: Progressing toward goal Pt will Go Up / Down Stairs: 3-5 stairs;with min assist;with least restrictive assistive device PT Goal: Up/Down Stairs - Progress: Goal set today  Visit Information  Last PT Received On: 07/22/12 Assistance Needed: +1    Subjective Data  Subjective: I'm doing better this afternoon Patient Stated Goal: Get out in my yard   [employer] Overall Cognitive Status: Appears within functional limits for tasks assessed/performed Arousal/Alertness: Awake/alert Orientation Level: Appears intact for tasks assessed Behavior During Session: [facility] for tasks performed    Balance     End of Session     GP     [name] 07/22/2012, 3:32 PM

## 2012-07-22 NOTE — Progress Notes (Signed)
RT set patient up on CPAP. She is on auto titrate due to not knowing her home settings, 5cm H2O min, to 20cm H2O max. She has her home tubing and nasal mask. Sterile water added to fill line and 2L of oxygen bled in. Patient states she is comfortable and will call if she needs further assistance.

## 2012-07-22 NOTE — Progress Notes (Signed)
   Subjective: 1 Day Post-Op Procedure(s) (LRB): TOTAL KNEE ARTHROPLASTY (Right) Patient reports pain as mild.   Patient seen in rounds with Dr. Lequita Halt.  She is in good spirits this morning on rounds. Patient is well, and has had no acute complaints or problems We will start therapy today.  Plan is to go Home after hospital stay.  Objective: Vital signs in last 24 hours: Temp:  [97.4 F (36.3 C)-99.1 F (37.3 C)] 97.8 F (36.6 C) (02/18 0916) Pulse Rate:  [70-91] 85 (02/18 0921) Resp:  [12-17] 16 (02/18 0916) BP: (103-143)/(66-82) 111/69 mmHg (02/18 0921) SpO2:  [92 %-100 %] 96 % (02/18 0916) Weight:  [97.523 kg (215 lb)] 97.523 kg (215 lb) (02/17 1730)  Intake/Output from previous day:  Intake/Output Summary (Last 24 hours) at 07/22/12 0947 Last data filed at 07/22/12 0916  Gross per 24 hour  Intake 3961.25 ml  Output   3002 ml  Net 959.25 ml    Intake/Output this shift: UOP 900 since MN Total I/O In: 240 [P.O.:240] Out: 200 [Urine:200]  Labs:  Recent Labs  07/22/12 0437  HGB 11.3*    Recent Labs  07/22/12 0437  WBC 10.8*  RBC 3.62*  HCT 32.4*  PLT 242    Recent Labs  07/22/12 0437  NA 135  K 3.5  CL 95*  CO2 30  BUN 8  CREATININE 0.53  GLUCOSE 144*  CALCIUM 8.3*    Recent Labs  07/21/12 1010 07/22/12 0437  INR 1.04 1.05    EXAM General - Patient is Alert, Appropriate and Oriented Extremity - Neurovascular intact Sensation intact distally Dorsiflexion/Plantar flexion intact Dressing - dressing C/D/I Motor Function - intact, moving foot and toes well on exam.  Hemovac pulled without difficulty.  Past Medical History  Diagnosis Date  . Arthritis   . Hypertension   . Anticoagulated on warfarin CHRONIC    HX PAF  . Osteoarthritis of knee BILATERAL  . Ankle edema LEFT  . Ureteral stone LEFT  . Macular degeneration of both eyes     RECEIVES AVASTIN INJECTION TO LEFT EYE EVERY 6-8 WKS  . Anxiety   . Atrial fibrillation  CARDIOLOGIST- DR Gloris Manchester TURNER LAST VISIT 02-20-11 (WILL REQUEST NOTE, STRESS TEST AND ECHO)    UNSUCCESSFUL CARDIOVERSION IN 2010--- CHRONIC COUMADIN THERAPY  . Thyroid dysfunction     SIDE EFFECT OF AMIODARONE--PT STATES LAST BLOOD TEST SHOWED THYROID LEVELS NORMAL  . Sleep apnea     SETTING IS 4 ON CPAP  . Diabetes mellitus     ORAL MED TO "PREVENT DIABETES"--PT DOES NOT HAVE TO CHECK HER SUGARS    Assessment/Plan: 1 Day Post-Op Procedure(s) (LRB): TOTAL KNEE ARTHROPLASTY (Right) Principal Problem:   OA (osteoarthritis) of knee  Estimated body mass index is 35.78 kg/(m^2) as calculated from the following:   Height as of this encounter: 5\' 5"  (1.651 m).   Weight as of this encounter: 97.523 kg (215 lb). Advance diet Up with therapy Discharge home with home health when ready  DVT Prophylaxis - Lovenox and Coumadin, INR is 1.05 this morning. Weight-Bearing as tolerated to right leg No vaccines. D/C O2 and Pulse OX and try on Room [address]  [name] 07/22/2012, 9:47 AM

## 2012-07-22 NOTE — Care Management Note (Addendum)
    Page 1 of 2   07/23/2012     1:19:58 PM   CARE MANAGEMENT NOTE 07/23/2012  Patient:  [name], [name]   Account Number:  [id number]  Date Initiated:  07/22/2012  Documentation initiated by:  Colleen Can  Subjective/Objective Assessment:   dx osteoarethritis right knee; total knee replacemnt on day of admission.  [facility] services.     Action/Plan:   CM spoke with patient. Plans are for her to return to her home in [place] [facility]) where her daughter and Luna Fuse will be her caregivers. She will need RW and 3n1   Anticipated DC Date:  07/24/2012   Anticipated DC Plan:  HOME W HOME HEALTH SERVICES  In-house referral  NA      DC Planning Services  CM consult      PAC Choice  DURABLE MEDICAL EQUIPMENT  HOME HEALTH   Choice offered to / List presented to:  C-1 Patient   DME arranged  3-N-1  Levan Hurst      DME agency  [facility]     [facility] arranged  HH-2 PT  HH-1 RN      [facility] agency  [facility]   Status of service:  Completed, signed off Medicare Important Message given?  NA - LOS <3 / Initial given by admissions (If response is "NO", the following Medicare IM given date fields will be blank) Date Medicare IM given:   Date Additional Medicare IM given:    Discharge Disposition:  HOME W HOME HEALTH SERVICES  Per UR Regulation:    If discussed at Long Length of Stay Meetings, dates discussed:    Comments:  07/22/2012 Damaris Schooner RN CCM [phone] Pt will require coumadin managemnt at home; [facility] and HHPt will be supplied by [place] with start date of day after discharge.

## 2012-07-22 NOTE — Progress Notes (Signed)
ANTICOAGULATION CONSULT NOTE - Follow Up Consult  Pharmacy Consult for Warfarin Indication: VTE prophylaxis  Allergies  Allergen Reactions  . Penicillins Rash    Patient Measurements: Height: 5\' 5"  (165.1 cm) Weight: 215 lb (97.523 kg) IBW/kg (Calculated) : 57   Vital Signs: Temp: 97.8 F (36.6 C) (02/18 0916) Temp src: Oral (02/18 0916) BP: 111/69 mmHg (02/18 0921) Pulse Rate: 85 (02/18 0921)  Labs:  Recent Labs  07/21/12 1010 07/22/12 0437  HGB  --  11.3*  HCT  --  32.4*  PLT  --  242  LABPROT 13.5 13.6  INR 1.04 1.05  CREATININE  --  0.53    Estimated Creatinine Clearance: 73.5 ml/min (by C-G formula based on Cr of 0.53).   Medications:  Lovenox 30mg  SQ q12h  Assessment: 72 yo F s/p R TKA, started on coumadin/LMWH for VTE px. Pt has a hx of Afib, on warfarin prior to admission. Home dose reported as warfarin 7.5mg  on MWF, and 5mg  on TuThSS.    INR unchanged after 1st dose coumadin  Hgb 14.1-->11.3 post-op.  Plts ok.  No bleed noted.   LMWH to be d/c'd when INR >/= 1.8  Goal of Therapy:  INR 2-3 Monitor platelets by anticoagulation protocol: Yes   Plan:  1.  Repeat coumadin 10mg  po x 1.  2.  Daily PT/INR   Haynes Hoehn, PharmD 07/22/2012 2:10 PM  Pager: [phone]

## 2012-07-22 NOTE — Evaluation (Signed)
Occupational Therapy Evaluation Patient Details Name: [name] MRN: [id number] DOB: 1941-04-05 Today's Date: 07/22/2012 Time: 4098-1191 OT Time Calculation (min): 27 min  OT Assessment / Plan / Recommendation Clinical Impression  Pt presents POD 1 RTKR. Skilled OT indicated to maximize independence with BADLs to supervision level in prep for d/c home with HHOT.    OT Assessment  Patient needs continued OT Services    Follow Up Recommendations  Home health OT    Barriers to Discharge      Equipment Recommendations  3 in 1 bedside comode    Recommendations for Other Services    Frequency  Min 2X/week    Precautions / Restrictions Precautions Precautions: Knee;Fall Required Braces or Orthoses: Knee Immobilizer - Right Knee Immobilizer - Right: Discontinue once straight leg raise with < 10 degree lag Restrictions Weight Bearing Restrictions: No   Pertinent Vitals/Pain Pt reported 8/10 pain in R knee following activity. Repositioned and cold applied.    ADL  Grooming: Teeth care;Min guard Where Assessed - Grooming: Supported standing Upper Body Bathing: Set up Where Assessed - Upper Body Bathing: Unsupported sitting Lower Body Bathing: Moderate assistance Where Assessed - Lower Body Bathing: Supported sit to stand Upper Body Dressing: Set up Where Assessed - Upper Body Dressing: Unsupported sitting Lower Body Dressing: Maximal assistance Where Assessed - Lower Body Dressing: Supported sit to Pharmacist, hospital Method: Sit to Barista: Raised toilet seat with arms (or 3-in-1 over toilet) Toileting - Clothing Manipulation and Hygiene: Minimal assistance Where Assessed - Toileting Clothing Manipulation and Hygiene: Sit to stand from 3-in-1 or toilet Equipment Used: Gait belt;Rolling walker Transfers/Ambulation Related to ADLs: Pt ambulated to the bathroom with min A and RW. ADL Comments: Max cues for safe manipulation of RW around bathroom and  safe toilet transfer.    OT Diagnosis: Generalized weakness  OT Problem List: Decreased safety awareness;Decreased activity tolerance;Decreased knowledge of use of DME or AE OT Treatment Interventions: Self-care/ADL training;Therapeutic activities;DME and/or AE instruction;Patient/family education   OT Goals Acute Rehab OT Goals OT Goal Formulation: With patient Time For Goal Achievement: 07/29/12 Potential to Achieve Goals: Good ADL Goals Pt Will Perform Grooming: with supervision;Standing at sink ADL Goal: Grooming - Progress: Goal set today Pt Will Perform Lower Body Bathing: with supervision;Sit to stand from chair;Sit to stand from bed ADL Goal: Lower Body Bathing - Progress: Goal set today Pt Will Perform Lower Body Dressing: with supervision;Sit to stand from chair;Sit to stand from bed ADL Goal: Lower Body Dressing - Progress: Goal set today Pt Will Transfer to Toilet: with supervision;with DME;Ambulation ADL Goal: Toilet Transfer - Progress: Goal set today Pt Will Perform Toileting - Clothing Manipulation: with supervision;Sitting on 3-in-1 or toilet;Standing ADL Goal: Toileting - Clothing Manipulation - Progress: Goal set today Pt Will Perform Toileting - Hygiene: with supervision;Sit to stand from 3-in-1/toilet ADL Goal: Toileting - Hygiene - Progress: Goal set today Pt Will Perform Tub/Shower Transfer: Shower transfer;with min assist;Ambulation;with DME ADL Goal: Tub/Shower Transfer - Progress: Goal set today  Visit Information  Last OT Received On: 07/22/12 Assistance Needed: +1    Subjective Data  Subjective: I love sitting in this chair! That bed is uncomfortable Patient Stated Goal: Return home with assist from grandson and daughter.   Prior Functioning     Home Living Lives With: Daughter Available Help at Discharge: Family Type of Home: House Home Access: Stairs to enter [employer] of Steps: 4 Entrance Stairs-Rails: Right;Left Home Layout:  One level Bathroom  Shower/Tub: Tub/shower unit;Walk-in shower Bathroom Toilet: Standard Home Adaptive Equipment: Straight cane;Tub transfer bench;Wheelchair - manual Prior Function Level of Independence: Independent with assistive device(s) Able to Take Stairs?: Yes Driving: Yes Vocation: Retired Musician: No difficulties Dominant Hand: Right         Vision/Perception     Copywriter, [employer] Overall Cognitive Status: Appears within functional limits for tasks assessed/performed Arousal/Alertness: Awake/alert Orientation Level: Appears intact for tasks assessed Behavior During Session: [facility] for tasks performed    Extremity/Trunk Assessment Right Upper Extremity Assessment RUE ROM/Strength/Tone: [facility] for tasks assessed Left Upper Extremity Assessment LUE ROM/Strength/Tone: WFL for tasks assessed     Mobility Transfers Sit to Stand: 4: Min assist;With upper extremity assist;3: Mod assist;From chair/3-in-1 Stand to Sit: 4: Min assist;With upper extremity assist;With armrests;To chair/3-in-1 Details for Transfer Assistance: mod cues for LE management and use of UEs to self assist.     Exercise     Balance     End of Session OT - End of Session Equipment Utilized During Treatment: Gait belt Activity Tolerance: Patient tolerated treatment well Patient left: in chair;with call bell/phone within reach  GO     [name] A 07/22/2012, 1:20 PM

## 2012-07-23 LAB — CBC
HCT: 34.4 % — ABNORMAL LOW (ref 36.0–46.0)
Hemoglobin: 11.7 g/dL — ABNORMAL LOW (ref 12.0–15.0)
MCH: 30.6 pg (ref 26.0–34.0)
MCV: 90.1 fL (ref 78.0–100.0)
RBC: 3.82 MIL/uL — ABNORMAL LOW (ref 3.87–5.11)
WBC: 18 10*3/uL — ABNORMAL HIGH (ref 4.0–10.5)

## 2012-07-23 LAB — BASIC METABOLIC PANEL
BUN: 14 mg/dL (ref 6–23)
CO2: 27 mEq/L (ref 19–32)
Calcium: 9.3 mg/dL (ref 8.4–10.5)
Chloride: 97 mEq/L (ref 96–112)
Creatinine, Ser: 0.51 mg/dL (ref 0.50–1.10)
Glucose, Bld: 181 mg/dL — ABNORMAL HIGH (ref 70–99)

## 2012-07-23 LAB — GLUCOSE, CAPILLARY: Glucose-Capillary: 180 mg/dL — ABNORMAL HIGH (ref 70–99)

## 2012-07-23 MED ORDER — METHOCARBAMOL 500 MG PO TABS: 500.0000 mg | ORAL_TABLET | Freq: Four times a day (QID) | ORAL | Status: DC | PRN

## 2012-07-23 MED ORDER — ENOXAPARIN SODIUM 30 MG/0.3ML ~~LOC~~ SOLN: 30.0000 mg | Freq: Two times a day (BID) | SUBCUTANEOUS | Status: DC

## 2012-07-23 MED ORDER — OXYCODONE HCL 5 MG PO TABS: 5.0000 mg | ORAL_TABLET | ORAL | Status: DC | PRN

## 2012-07-23 MED ORDER — WARFARIN SODIUM 5 MG PO TABS: 2.5000 mg | ORAL_TABLET | Freq: Every day | ORAL | Status: DC

## 2012-07-23 NOTE — Progress Notes (Signed)
Occupational Therapy Treatment Patient Details Name: [name] MRN: [id number] DOB: 10-03-1940 Today's Date: 07/23/2012 Time: 6295-2841 OT Time Calculation (min): 17 min  OT Assessment / Plan / Recommendation Comments on Treatment Session Pt making steady progress.    Follow Up Recommendations  Home health OT    Barriers to Discharge       Equipment Recommendations  3 in 1 bedside comode    Recommendations for Other Services    Frequency Min 2X/week   Plan Discharge plan remains appropriate    Precautions / Restrictions Precautions Precautions: Knee;Fall   Pertinent Vitals/Pain Pt reported 5/10 pain with activity. Repositioned for comfort.    ADL  Lower Body Dressing: Min guard Toilet Transfer: Minimal assistance Toilet Transfer Method: Sit to stand Toilet Transfer Equipment: Raised toilet seat with arms (or 3-in-1 over toilet) Toileting - Clothing Manipulation and Hygiene: Min guard Where Assessed - Toileting Clothing Manipulation and Hygiene: Sit to stand from 3-in-1 or toilet Tub/Shower Transfer: Minimal assistance Tub/Shower Transfer Method: Science writer: Walk in shower Equipment Used: Rolling walker ADL Comments: Pt instructed how to don underwear with good return demo. Pt able to perform without physical A. Also instructed pt how to safely step into and out of shower with good return demo.    OT Diagnosis:    OT Problem List:   OT Treatment Interventions:     OT Goals ADL Goals ADL Goal: Lower Body Dressing - Progress: Progressing toward goals ADL Goal: Toilet Transfer - Progress: Progressing toward goals ADL Goal: Toileting - Clothing Manipulation - Progress: Progressing toward goals ADL Goal: Toileting - Hygiene - Progress: Progressing toward goals Pt Will Perform Tub/Shower Transfer: Shower transfer;with supervision;Ambulation ADL Goal: Tub/Shower Transfer - Progress: Updated due to goal met  Visit Information  Last OT  Received On: 07/23/12 Assistance Needed: +1    Subjective Data  Subjective: I feel like I'm doing really well.   Prior Functioning       Cognition  Cognition Overall Cognitive Status: Appears within functional limits for tasks assessed/performed Arousal/Alertness: Awake/alert Orientation Level: Appears intact for tasks assessed Behavior During Session: [facility] for tasks performed    Mobility  Transfers Sit to Stand: 4: Min guard;4: Min assist;With upper extremity assist;From chair/3-in-1 Stand to Sit: 4: Min guard;4: Min assist;With upper extremity assist;To chair/3-in-1 Details for Transfer Assistance: mod cues for LE management and hand placement.    Exercises      Balance     End of Session OT - End of Session Activity Tolerance: Patient tolerated treatment well Patient left: in chair;with call bell/phone within reach  GO     Fynley Chrystal A OTR/L [phone] 07/23/2012, 12:25 PM

## 2012-07-23 NOTE — Progress Notes (Signed)
07/23/2012 Colleen Can BSN RN zCCm [phone] CM advised that [facility] not be able to provide [facility] services d/t staffing. CM spoke with [facility] rep who advised they would be able to provide HHservices-HHRN, PT, and OT as ordered. Patient advised  and was given contact phone number for [facility].

## 2012-07-23 NOTE — Discharge Summary (Signed)
Physician Discharge Summary   Patient ID: [name] MRN: [id number] DOB/AGE: 09/04/1940 72 y.o.  Admit date: 07/21/2012 Discharge date: 07/23/2012  Primary Diagnosis: Osteoarthritis Right knee  Admission Diagnoses:  Past Medical History  Diagnosis Date  . Arthritis   . Hypertension   . Anticoagulated on warfarin CHRONIC    HX PAF  . Osteoarthritis of knee BILATERAL  . Ankle edema LEFT  . Ureteral stone LEFT  . Macular degeneration of both eyes     RECEIVES AVASTIN INJECTION TO LEFT EYE EVERY 6-8 WKS  . Anxiety   . Atrial fibrillation CARDIOLOGIST- DR Gloris Manchester [facility]NER LAST VISIT 02-20-11 (WILL REQUEST NOTE, STRESS TEST AND ECHO)    UNSUCCESSFUL CARDIOVERSION IN 2010--- CHRONIC COUMADIN THERAPY  . Thyroid dysfunction     SIDE EFFECT OF AMIODARONE--PT STATES LAST BLOOD TEST SHOWED THYROID LEVELS NORMAL  . Sleep apnea     SETTING IS 4 ON CPAP  . Diabetes mellitus     ORAL MED TO "PREVENT DIABETES"--PT DOES NOT HAVE TO CHECK HER SUGARS   Discharge Diagnoses:   Principal Problem:   OA (osteoarthritis) of knee  Estimated body mass index is 35.78 kg/(m^2) as calculated from the following:   Height as of this encounter: 5\' 5"  (1.651 m).   Weight as of this encounter: 97.523 kg (215 lb).  Classification of overweight in adults according to BMI (WHO, 1998)   Procedure:  Procedure(s) (LRB): TOTAL KNEE ARTHROPLASTY (Right)   Consults: None  HPI: [name] is a 72 y.o. year old female with end stage OA of her right knee with progressively worsening pain and dysfunction. She has constant pain, with activity and at rest and significant functional deficits with difficulties even with ADLs. She has had extensive non-op management including analgesics, injections of cortisone and viscosupplements, and home exercise program, but remains in significant pain with significant dysfunction.Radiographs show bone on bone arthritis medial and patellofemoral with large varus deformity. She  presents now for right Total Knee Arthroplasty.   Laboratory Data: Admission on 07/21/2012  Component Date Value Range Status  . Prothrombin Time 07/21/2012 13.5  11.6 - 15.2 seconds Final  . INR 07/21/2012 1.04  0.00 - 1.49 Final  . Glucose-Capillary 07/21/2012 134* 70 - 99 mg/dL Final  . Comment 1 57/84/6962 Documented in Chart   Final  . Comment 2 07/21/2012 Notify RN   Final  . Glucose-Capillary 07/21/2012 155* 70 - 99 mg/dL Final  . WBC 95/28/4132 10.8* 4.0 - 10.5 K/uL Final  . RBC 07/22/2012 3.62* 3.87 - 5.11 MIL/uL Final  . Hemoglobin 07/22/2012 11.3* 12.0 - 15.0 g/dL Final  . HCT 44/06/270 32.4* 36.0 - 46.0 % Final  . MCV 07/22/2012 89.5  78.0 - 100.0 fL Final  . MCH 07/22/2012 31.2  26.0 - 34.0 pg Final  . MCHC 07/22/2012 34.9  30.0 - 36.0 g/dL Final  . RDW 53/66/4403 12.5  11.5 - 15.5 % Final  . Platelets 07/22/2012 242  150 - 400 K/uL Final  . Sodium 07/22/2012 135  135 - 145 mEq/L Final  . Potassium 07/22/2012 3.5  3.5 - 5.1 mEq/L Final  . Chloride 07/22/2012 95* 96 - 112 mEq/L Final  . CO2 07/22/2012 30  19 - 32 mEq/L Final  . Glucose, Bld 07/22/2012 144* 70 - 99 mg/dL Final  . BUN 47/42/5956 8  6 - 23 mg/dL Final  . Creatinine, Ser 07/22/2012 0.53  0.50 - 1.10 mg/dL Final  . Calcium 38/75/6433 8.3* 8.4 - 10.5 mg/dL  Final  . GFR calc non Af Amer 07/22/2012 >90  >90 mL/min Final  . GFR calc Af Amer 07/22/2012 >90  >90 mL/min Final   Comment:                                 The eGFR has been calculated                          using the CKD EPI equation.                          This calculation has not been                          validated in all clinical                          situations.                          eGFR's persistently                          <90 mL/min signify                          possible Chronic Kidney Disease.  Marland Kitchen Prothrombin Time 07/22/2012 13.6  11.6 - 15.2 seconds Final  . INR 07/22/2012 1.05  0.00 - 1.49 Final  . Glucose-Capillary  07/21/2012 151* 70 - 99 mg/dL Final  . Glucose-Capillary 07/22/2012 159* 70 - 99 mg/dL Final  . Glucose-Capillary 07/22/2012 162* 70 - 99 mg/dL Final  . Glucose-Capillary 07/22/2012 190* 70 - 99 mg/dL Final  . WBC 21/30/8657 18.0* 4.0 - 10.5 K/uL Final  . RBC 07/23/2012 3.82* 3.87 - 5.11 MIL/uL Final  . Hemoglobin 07/23/2012 11.7* 12.0 - 15.0 g/dL Final  . HCT 84/69/6295 34.4* 36.0 - 46.0 % Final  . MCV 07/23/2012 90.1  78.0 - 100.0 fL Final  . MCH 07/23/2012 30.6  26.0 - 34.0 pg Final  . MCHC 07/23/2012 34.0  30.0 - 36.0 g/dL Final  . RDW 28/41/3244 12.4  11.5 - 15.5 % Final  . Platelets 07/23/2012 246  150 - 400 K/uL Final  . Sodium 07/23/2012 135  135 - 145 mEq/L Final  . Potassium 07/23/2012 3.9  3.5 - 5.1 mEq/L Final  . Chloride 07/23/2012 97  96 - 112 mEq/L Final  . CO2 07/23/2012 27  19 - 32 mEq/L Final  . Glucose, Bld 07/23/2012 181* 70 - 99 mg/dL Final  . BUN 06/06/7251 14  6 - 23 mg/dL Final  . Creatinine, Ser 07/23/2012 0.51  0.50 - 1.10 mg/dL Final  . Calcium 66/44/0347 9.3  8.4 - 10.5 mg/dL Final  . GFR calc non Af Amer 07/23/2012 >90  >90 mL/min Final  . GFR calc Af Amer 07/23/2012 >90  >90 mL/min Final   Comment:                                 The eGFR has been calculated  using the CKD EPI equation.                          This calculation has not been                          validated in all clinical                          situations.                          eGFR's persistently                          <90 mL/min signify                          possible Chronic Kidney Disease.  Marland Kitchen Prothrombin Time 07/23/2012 16.7* 11.6 - 15.2 seconds Final  . INR 07/23/2012 1.39  0.00 - 1.49 Final  . Glucose-Capillary 07/22/2012 216* 70 - 99 mg/dL Final  . Glucose-Capillary 07/23/2012 180* 70 - 99 mg/dL Final  Hospital Outpatient Visit on 07/14/2012  Component Date Value Range Status  . ABO/RH(D) 07/14/2012 B NEG   Final  Hospital Outpatient Visit  on 07/14/2012  Component Date Value Range Status  . MRSA, PCR 07/14/2012 NEGATIVE  NEGATIVE Final  . Staphylococcus aureus 07/14/2012 POSITIVE* NEGATIVE Final   Comment:                                 The Xpert SA Assay (FDA                          approved for NASAL specimens                          in patients over 47 years of age),                          is one component of                          a comprehensive surveillance                          program.  Test performance has                          been validated by [employer] for patients greater                          than or equal to 35 year old.                          It is not intended                          to diagnose  infection nor to                          guide or monitor treatment.  Marland Kitchen aPTT 07/14/2012 37  24 - 37 seconds Final   Comment:                                 IF BASELINE aPTT IS ELEVATED,                          SUGGEST PATIENT RISK ASSESSMENT                          BE USED TO DETERMINE APPROPRIATE                          ANTICOAGULANT THERAPY.  . WBC 07/14/2012 7.4  4.0 - 10.5 K/uL Final  . RBC 07/14/2012 4.59  3.87 - 5.11 MIL/uL Final  . Hemoglobin 07/14/2012 14.1  12.0 - 15.0 g/dL Final  . HCT 16/03/9603 41.0  36.0 - 46.0 % Final  . MCV 07/14/2012 89.3  78.0 - 100.0 fL Final  . MCH 07/14/2012 30.7  26.0 - 34.0 pg Final  . MCHC 07/14/2012 34.4  30.0 - 36.0 g/dL Final  . RDW 54/02/8118 12.2  11.5 - 15.5 % Final  . Platelets 07/14/2012 298  150 - 400 K/uL Final  . Sodium 07/14/2012 135  135 - 145 mEq/L Final  . Potassium 07/14/2012 5.4* 3.5 - 5.1 mEq/L Final   Comment: HEMOLYZED SPECIMEN, RESULTS MAY BE AFFECTED                          HEMOLYSIS AT THIS LEVEL MAY AFFECT RESULT  . Chloride 07/14/2012 96  96 - 112 mEq/L Final  . CO2 07/14/2012 30  19 - 32 mEq/L Final  . Glucose, Bld 07/14/2012 109* 70 - 99 mg/dL Final  . BUN 14/78/2956 15  6 - 23 mg/dL  Final  . Creatinine, Ser 07/14/2012 0.50  0.50 - 1.10 mg/dL Final  . Calcium 21/30/8657 9.6  8.4 - 10.5 mg/dL Final  . Total Protein 07/14/2012 8.3  6.0 - 8.3 g/dL Final  . Albumin 84/69/6295 4.1  3.5 - 5.2 g/dL Final  . AST 28/41/3244 57* 0 - 37 U/L Final  . ALT 07/14/2012 24  0 - 35 U/L Final   Comment: HEMOLYZED SPECIMEN, RESULTS MAY BE AFFECTED                          HEMOLYSIS AT THIS LEVEL MAY AFFECT RESULT  . Alkaline Phosphatase 07/14/2012 67  39 - 117 U/L Final   Comment: HEMOLYZED SPECIMEN, RESULTS MAY BE AFFECTED                          HEMOLYSIS AT THIS LEVEL MAY AFFECT RESULT  . Total Bilirubin 07/14/2012 0.9  0.3 - 1.2 mg/dL Final  . GFR calc non Af Amer 07/14/2012 >90  >90 mL/min Final  . GFR calc Af Amer 07/14/2012 >90  >90 mL/min Final   Comment:  The eGFR has been calculated                          using the CKD EPI equation.                          This calculation has not been                          validated in all clinical                          situations.                          eGFR's persistently                          <90 mL/min signify                          possible Chronic Kidney Disease.  Marland Kitchen Prothrombin Time 07/14/2012 19.9* 11.6 - 15.2 seconds Final  . INR 07/14/2012 1.76* 0.00 - 1.49 Final  . ABO/RH(D) 07/14/2012 B NEG   Final  . Antibody Screen 07/14/2012 NEG   Final  . Sample Expiration 07/14/2012 07/24/2012   Final  . Color, Urine 07/14/2012 YELLOW  YELLOW Final  . APPearance 07/14/2012 CLOUDY* CLEAR Final  . Specific Gravity, Urine 07/14/2012 1.015  1.005 - 1.030 Final  . pH 07/14/2012 8.0  5.0 - 8.0 Final  . Glucose, UA 07/14/2012 NEGATIVE  NEGATIVE mg/dL Final  . Hgb urine dipstick 07/14/2012 NEGATIVE  NEGATIVE Final  . Bilirubin Urine 07/14/2012 NEGATIVE  NEGATIVE Final  . Ketones, ur 07/14/2012 NEGATIVE  NEGATIVE mg/dL Final  . Protein, ur 16/03/9603 NEGATIVE  NEGATIVE mg/dL Final  .  Urobilinogen, UA 07/14/2012 0.2  0.0 - 1.0 mg/dL Final  . Nitrite 54/02/8118 NEGATIVE  NEGATIVE Final  . Leukocytes, UA 07/14/2012 NEGATIVE  NEGATIVE Final   MICROSCOPIC NOT DONE ON URINES WITH NEGATIVE PROTEIN, BLOOD, LEUKOCYTES, NITRITE, OR GLUCOSE <1000 mg/dL.     X-Rays:Dg Chest 2 View  07/14/2012  *RADIOLOGY REPORT*  Clinical Data: Preop right knee total arthroplasty.  CHEST - 2 VIEW  Comparison: Two-view chest 02/20/2011.  Findings: The heart size is normal.  The lung volumes are low. Chronic interstitial markings are similar to the prior exam. Exaggerated thoracic kyphosis is evident.  IMPRESSION:  1.  No acute cardiopulmonary disease or significant interval change. 2.  Stable chronic interstitial coarsening.   Original Report Authenticated By: Marin Roberts, M.D.    Mm Digital Screening  07/04/2012  *RADIOLOGY REPORT*  Clinical Data: Screening.  DIGITAL BILATERAL SCREENING MAMMOGRAM WITH CAD  Comparison:  Previous exams.  FINDINGS:  ACR Breast Density Category 2: There is a scattered fibroglandular pattern.  No suspicious masses, architectural distortion, or calcifications are present.  Images were processed with CAD.  IMPRESSION: No mammographic evidence of malignancy.  A result letter of this screening mammogram will be mailed directly to the patient.  RECOMMENDATION: Screening mammogram in one year. (Code:SM-B-01Y)  BI-RADS CATEGORY 1:  Negative.   Original Report Authenticated By: Christiana Pellant, M.D.     EKG: Orders placed during the hospital encounter of 09/18/11  . EKG 12-LEAD  . EKG 12-LEAD  . EKG 12-LEAD  .  EKG 12-LEAD  . EKG 12-LEAD  . EKG 12-LEAD  . EKG     Hospital Course: CANDIA KINGSBURY is a 72 y.o. who was admitted to [facility]. They were brought to the operating room on 07/21/2012 and underwent Procedure(s): TOTAL KNEE ARTHROPLASTY.  Patient tolerated the procedure well and was later transferred to the recovery room and then to the orthopaedic floor for  postoperative care.  They were given PO and IV analgesics for pain control following their surgery.  They were given 24 hours of postoperative antibiotics of  Anti-infectives   Start     Dose/Rate Route Frequency Ordered Stop   07/21/12 1800  ceFAZolin (ANCEF) IVPB 2 g/50 mL premix    Comments:  Mon Jul 21, 2012 1324    2 g 100 mL/hr over 30 Minutes Intravenous Every 6 hours 07/21/12 1446 07/22/12 0023   07/21/12 0915  ceFAZolin (ANCEF) IVPB 2 g/50 mL premix     2 g 100 mL/hr over 30 Minutes Intravenous On call to O.R. 07/21/12 1914 07/21/12 1149     and started on DVT prophylaxis in the form of Lovenox and Coumadin.   PT and OT were ordered for total joint protocol.  Discharge planning consulted to help with postop disposition and equipment needs.  Patient had a good night on the evening of surgery and started to get up OOB with therapy on day one. Hemovac drain was pulled without difficulty.  Continued to work with therapy into day two.  Dressing was changed on day two and the incision was healing well.  Patient was seen in rounds and was ready to go home later that same day.   Discharge Medications: Prior to Admission medications   Medication Sig Start Date End Date Taking? Authorizing Provider  Aflibercept 2 MG/0.05ML SOLN Inject into the eye. INJECTION 2MG  TO LEFT EYE EVERY 8 WEEKS---AT DR. SANDERS[facility] [place].   Yes Historical Provider, MD  diltiazem (CARDIZEM CD) 120 MG 24 hr capsule Take 120 mg by mouth every morning.   Yes Historical Provider, MD  furosemide (LASIX) 40 MG tablet Take 40 mg by mouth every morning.    Yes Historical Provider, MD  losartan-hydrochlorothiazide (HYZAAR) 100-12.5 MG per tablet Take 1 tablet by mouth daily. TAKES IN AM   Yes Historical Provider, MD  metFORMIN (GLUCOPHAGE) 500 MG tablet Take 500 mg by mouth daily with breakfast.    Yes Historical Provider, MD  metoprolol (LOPRESSOR) 50 MG tablet Take 25 mg by mouth 2 (two) times daily.   Yes  Historical Provider, MD  potassium chloride (K-DUR,KLOR-CON) 10 MEQ tablet Take 10 mEq by mouth 3 (three) times daily with meals.    Yes Historical Provider, MD  traMADol-acetaminophen (ULTRACET) 37.5-325 MG per tablet Take 1 tablet by mouth every 6 (six) hours as needed. For pain   Yes Historical Provider, MD  enoxaparin (LOVENOX) 30 MG/0.3ML injection Inject 0.3 mLs (30 mg total) into the skin every 12 (twelve) hours. Continue Lovenox injections until the INR is therapeutic at or greater than 2.0.  When INR reaches the therapeutic level of equal to or greater than 2.0, the patient may discontinue the Lovenox injections. 07/23/12   Alexzandrew Julien Girt, PA  methocarbamol (ROBAXIN) 500 MG tablet Take 1 tablet (500 mg total) by mouth every 6 (six) hours as needed. 07/23/12   Alexzandrew Julien Girt, PA  oxyCODONE (OXY IR/ROXICODONE) 5 MG immediate release tablet Take 1-2 tablets (5-10 mg total) by mouth every 3 (three) hours as needed. 07/23/12  Alexzandrew Perkins, PA  potassium citrate (UROCIT-K) 10 MEQ (1080 MG) SR tablet Take 10 mEq by mouth 3 (three) times daily with meals.    Historical Provider, MD  warfarin (COUMADIN) 5 MG tablet Take 0.5-1 tablets (2.5-5 mg total) by mouth daily. Take Coumadin for 3 weeks for postoperative protocol, then patient may resume their previous Coumadin home regimen.  The dose may need to be adjusted based upon the INR.  Please follow the INR and titrate Coumadin dose for a therapeutic range between 2.0 and 3.0 INR.  After completing the three weeks of Coumadin, the patient may resume their previous Coumadin home regimen. 07/23/12   Alexzandrew Julien Girt, PA    Diet: Cardiac diet and Diabetic diet Activity:WBAT Follow-up:in 2 weeks Disposition - Home Discharged Condition: good   Discharge Orders   Future Orders Complete By Expires     Call MD / Call 911  As directed     Comments:      If you experience chest pain or shortness of breath, CALL 911 and be transported to the  hospital emergency room.  If you develope a fever above 101 F, pus (white drainage) or increased drainage or redness at the wound, or calf pain, call your surgeon's office.    Change dressing  As directed     Comments:      Change dressing daily with sterile 4 x 4 inch gauze dressing and apply TED hose. Do not submerge the incision under water.    Constipation Prevention  As directed     Comments:      Drink plenty of fluids.  Prune juice may be helpful.  You may use a stool softener, such as Colace (over the counter) 100 mg twice a day.  Use MiraLax (over the counter) for constipation as needed.    Diet - low sodium heart healthy  As directed     Diet Carb Modified  As directed     Discharge instructions  As directed     Comments:      Pick up stool softner and laxative for home. Do not submerge incision under water. May shower. Continue to use ice for pain and swelling from surgery.  Take Coumadin for three weeks for postoperative protocol and then the patient may resume their previous Coumadin home regimen.  The dose may need to be adjusted based upon the INR.  Please follow the INR and titrate Coumadin dose for a therapeutic range between 2.0 and 3.0 INR.  After completing the three weeks of Coumadin, the patient may resume their previous Coumadin home regimen.  Continue Lovenox injections until the INR is therapeutic at or greater than 2.0.  When INR reaches the therapeutic level of equal to or greater than 2.0, the patient may discontinue the Lovenox injections.    Do not put a pillow under the knee. Place it under the heel.  As directed     Do not sit on low chairs, stoools or toilet seats, as it may be difficult to get up from low surfaces  As directed     Driving restrictions  As directed     Comments:      No driving until released by the physician.    Increase activity slowly as tolerated  As directed     Lifting restrictions  As directed     Comments:      No lifting until  released by the physician.    Patient may shower  As directed  Comments:      You may shower without a dressing once there is no drainage.  Do not wash over the wound.  If drainage remains, do not shower until drainage stops.    TED hose  As directed     Comments:      Use stockings (TED hose) for 3 weeks on both leg(s).  You may remove them at night for sleeping.    Weight bearing as tolerated  As directed         Medication List    STOP taking these medications       b complex vitamins tablet     calcium carbonate 600 MG Tabs  Commonly known as:  OS-CAL     cholecalciferol 1000 UNITS tablet  Commonly known as:  VITAMIN D     Fish Oil 1000 MG Caps     ICAPS Caps     losartan-hydrochlorothiazide 50-12.5 MG per tablet  Commonly known as:  HYZAAR     Magnesium 400 MG Caps     vitamin B-12 1000 MCG tablet  Commonly known as:  CYANOCOBALAMIN      TAKE these medications       Aflibercept 2 MG/0.05ML Soln  Inject into the eye. INJECTION 2MG  TO LEFT EYE EVERY 8 WEEKS---AT DR. [facility] [place].     diltiazem 120 MG 24 hr capsule  Commonly known as:  CARDIZEM CD  Take 120 mg by mouth every morning.     enoxaparin 30 MG/0.3ML injection  Commonly known as:  LOVENOX  Inject 0.3 mLs (30 mg total) into the skin every 12 (twelve) hours. Continue Lovenox injections until the INR is therapeutic at or greater than 2.0.  When INR reaches the therapeutic level of equal to or greater than 2.0, the patient may discontinue the Lovenox injections.     furosemide 40 MG tablet  Commonly known as:  LASIX  Take 40 mg by mouth every morning.     losartan-hydrochlorothiazide 100-12.5 MG per tablet  Commonly known as:  HYZAAR  Take 1 tablet by mouth daily. TAKES IN AM     metFORMIN 500 MG tablet  Commonly known as:  GLUCOPHAGE  Take 500 mg by mouth daily with breakfast.     methocarbamol 500 MG tablet  Commonly known as:  ROBAXIN  Take 1 tablet (500 mg total)  by mouth every 6 (six) hours as needed.     metoprolol 50 MG tablet  Commonly known as:  LOPRESSOR  Take 25 mg by mouth 2 (two) times daily.     oxyCODONE 5 MG immediate release tablet  Commonly known as:  Oxy IR/ROXICODONE  Take 1-2 tablets (5-10 mg total) by mouth every 3 (three) hours as needed.     potassium chloride 10 MEQ tablet  Commonly known as:  K-DUR,KLOR-CON  Take 10 mEq by mouth 3 (three) times daily with meals.     potassium citrate 10 MEQ (1080 MG) SR tablet  Commonly known as:  UROCIT-K  Take 10 mEq by mouth 3 (three) times daily with meals.     traMADol-acetaminophen 37.5-325 MG per tablet  Commonly known as:  ULTRACET  Take 1 tablet by mouth every 6 (six) hours as needed. For pain     warfarin 5 MG tablet  Commonly known as:  COUMADIN  Take 0.5-1 tablets (2.5-5 mg total) by mouth daily. Take Coumadin for 3 weeks for postoperative protocol, then patient may resume their previous Coumadin home regimen.  The dose may  need to be adjusted based upon the INR.  Please follow the INR and titrate Coumadin dose for a therapeutic range between 2.0 and 3.0 INR.  After completing the three weeks of Coumadin, the patient may resume their previous Coumadin home regimen.           Follow-up Information   Follow up with Loanne Drilling, MD. Schedule an appointment as soon as possible for a visit in 2 weeks.   Contact information:   [address] [place] [address] [phone]       Signed: Patrica Duel 07/23/2012, 8:05 AM

## 2012-07-23 NOTE — Progress Notes (Signed)
Physical Therapy Treatment Patient Details Name: [name] MRN: [id number] DOB: 05-16-1941 Today's Date: 07/23/2012 Time: 8657-8469 PT Time Calculation (min): 28 min  PT Assessment / Plan / Recommendation Comments on Treatment Session  Reviewed home program; Dtr  present for family ed on stairs    Follow Up Recommendations  Home health PT     Does the patient have the potential to tolerate intense rehabilitation     Barriers to Discharge        Equipment Recommendations  Rolling walker with 5" wheels    Recommendations for Other Services OT consult  Frequency 7X/week   Plan Discharge plan remains appropriate    Precautions / Restrictions Precautions Precautions: Knee;Fall Required Braces or Orthoses: Knee Immobilizer - Right Knee Immobilizer - Right: Discontinue once straight leg raise with < 10 degree lag Restrictions Weight Bearing Restrictions: No Other Position/Activity Restrictions: WBAT   Pertinent Vitals/Pain     Mobility  Transfers Transfers: Sit to Stand;Stand to Sit Sit to Stand: 4: Min guard;4: Min assist;With upper extremity assist;From chair/3-in-1 Stand to Sit: 4: Min guard;4: Min assist;With upper extremity assist;To chair/3-in-1 Details for Transfer Assistance: mod cues for LE management and hand placement. Ambulation/Gait Ambulation/Gait Assistance: 4: Min guard;5: Supervision Ambulation Distance (Feet): 50 Feet Assistive device: Rolling walker Ambulation/Gait Assistance Details: min cues for position from RW Gait Pattern: Step-to pattern Gait velocity: slowwww Stairs: Yes Stairs Assistance: 4: Min assist Stairs Assistance Details (indicate cue type and reason): cues for sequence and foot/crutch placement Stair Management Technique: One rail Right;Forwards;Step to pattern;With crutches Number of Stairs: 4    Exercises     PT Diagnosis:    PT Problem List:   PT Treatment Interventions:     PT Goals Acute Rehab PT Goals PT Goal  Formulation: With patient Time For Goal Achievement: 07/26/12 Potential to Achieve Goals: Good Pt will go Supine/Side to Sit: with supervision PT Goal: Supine/Side to Sit - Progress: Progressing toward goal Pt will go Sit to Supine/Side: with supervision PT Goal: Sit to Supine/Side - Progress: Progressing toward goal Pt will go Sit to Stand: with supervision PT Goal: Sit to Stand - Progress: Progressing toward goal Pt will go Stand to Sit: with supervision PT Goal: Stand to Sit - Progress: Progressing toward goal Pt will Ambulate: 51 - 150 feet;with supervision;with rolling walker PT Goal: Ambulate - Progress: Progressing toward goal Pt will Go Up / Down Stairs: 3-5 stairs;with min assist;with least restrictive assistive device PT Goal: Up/Down Stairs - Progress: Met  Visit Information  Last PT Received On: 07/23/12 Assistance Needed: +1    Subjective Data  Patient Stated Goal: Get out in my yard   Cognition  Cognition Overall Cognitive Status: Appears within functional limits for tasks assessed/performed Arousal/Alertness: Awake/alert Orientation Level: Appears intact for tasks assessed Behavior During Session: [facility] for tasks performed    Balance     End of Session PT - End of Session Equipment Utilized During Treatment: Gait belt Activity Tolerance: Patient tolerated treatment well Patient left: in chair;with call bell/phone within reach;with family/visitor present Nurse Communication: Mobility status   GP     [name] 07/23/2012, 1:26 PM

## 2012-07-23 NOTE — Progress Notes (Signed)
   Subjective: 2 Days Post-Op Procedure(s) (LRB): TOTAL KNEE ARTHROPLASTY (Right) Patient reports pain as mild.   Patient seen in rounds with Dr. Lequita Halt. Patient is well, and has had no acute complaints or problems Patient is ready to go home later today.  Objective: Vital signs in last 24 hours: Temp:  [97.8 F (36.6 C)-98.2 F (36.8 C)] 98.1 F (36.7 C) (02/19 0534) Pulse Rate:  [83-108] 108 (02/19 0534) Resp:  [16-18] 17 (02/19 0534) BP: (111-161)/(69-86) 161/86 mmHg (02/19 0534) SpO2:  [95 %-98 %] 95 % (02/19 0534)  Intake/Output from previous day:  Intake/Output Summary (Last 24 hours) at 07/23/12 0754 Last data filed at 07/23/12 0500  Gross per 24 hour  Intake   1571 ml  Output   2850 ml  Net  -1279 ml    Intake/Output this shift:    Labs:  Recent Labs  07/22/12 0437 07/23/12 0430  HGB 11.3* 11.7*    Recent Labs  07/22/12 0437 07/23/12 0430  WBC 10.8* 18.0*  RBC 3.62* 3.82*  HCT 32.4* 34.4*  PLT 242 246    Recent Labs  07/22/12 0437 07/23/12 0430  NA 135 135  K 3.5 3.9  CL 95* 97  CO2 30 27  BUN 8 14  CREATININE 0.53 0.51  GLUCOSE 144* 181*  CALCIUM 8.3* 9.3    Recent Labs  07/22/12 0437 07/23/12 0430  INR 1.05 1.39    EXAM: General - Patient is Alert, Appropriate and Oriented Extremity - Neurovascular intact Sensation intact distally Dorsiflexion/Plantar flexion intact No cellulitis present Incision - clean, dry, no drainage, healing Motor Function - intact, moving foot and toes well on exam.   Assessment/Plan: 2 Days Post-Op Procedure(s) (LRB): TOTAL KNEE ARTHROPLASTY (Right) Procedure(s) (LRB): TOTAL KNEE ARTHROPLASTY (Right) Past Medical History  Diagnosis Date  . Arthritis   . Hypertension   . Anticoagulated on warfarin CHRONIC    HX PAF  . Osteoarthritis of knee BILATERAL  . Ankle edema LEFT  . Ureteral stone LEFT  . Macular degeneration of both eyes     RECEIVES AVASTIN INJECTION TO LEFT EYE EVERY 6-8 WKS    . Anxiety   . Atrial fibrillation CARDIOLOGIST- DR Gloris Manchester TURNER LAST VISIT 02-20-11 (WILL REQUEST NOTE, STRESS TEST AND ECHO)    UNSUCCESSFUL CARDIOVERSION IN 2010--- CHRONIC COUMADIN THERAPY  . Thyroid dysfunction     SIDE EFFECT OF AMIODARONE--PT STATES LAST BLOOD TEST SHOWED THYROID LEVELS NORMAL  . Sleep apnea     SETTING IS 4 ON CPAP  . Diabetes mellitus     ORAL MED TO "PREVENT DIABETES"--PT DOES NOT HAVE TO CHECK HER SUGARS   Principal Problem:   OA (osteoarthritis) of knee  Estimated body mass index is 35.78 kg/(m^2) as calculated from the following:   Height as of this encounter: 5\' 5"  (1.651 m).   Weight as of this encounter: 97.523 kg (215 lb). Advance diet Up with therapy Discharge home with home health Diet - Cardiac diet and Diabetic diet Follow up - in 2 weeks Activity - WBAT Disposition - Home Condition Upon Discharge - Good D/C Meds - See DC Summary DVT Prophylaxis - Lovenox and Coumadin  PERKINS, ALEXZANDREW 07/23/2012, 7:54 AM

## 2012-07-23 NOTE — Progress Notes (Signed)
Physical Therapy Treatment Patient Details Name: [name] MRN: [id number] DOB: August 30, 1940 Today's Date: 07/23/2012 Time: 7829-5621 PT Time Calculation (min): 46 min  PT Assessment / Plan / Recommendation Comments on Treatment Session       Follow Up Recommendations  Home health PT     Does the patient have the potential to tolerate intense rehabilitation     Barriers to Discharge        Equipment Recommendations  Rolling walker with 5" wheels    Recommendations for Other Services OT consult  Frequency 7X/week   Plan Discharge plan remains appropriate    Precautions / Restrictions Precautions Precautions: Knee;Fall Required Braces or Orthoses: Knee Immobilizer - Right Knee Immobilizer - Right: Discontinue once straight leg raise with < 10 degree lag Restrictions Weight Bearing Restrictions: No Other Position/Activity Restrictions: WBAT   Pertinent Vitals/Pain 5/10; premed, cold packs provided    Mobility  Bed Mobility Bed Mobility: Supine to Sit Sit to Supine: 4: Min guard Details for Bed Mobility Assistance: cues for sequence and use of L LE to self assist Transfers Transfers: Sit to Stand;Stand to Sit Sit to Stand: 4: Min guard Stand to Sit: 4: Min guard Details for Transfer Assistance: mod cues for LE management and use of UEs to self assist. Ambulation/Gait Ambulation/Gait Assistance: 4: Min guard Ambulation Distance (Feet): 79 Feet Assistive device: Rolling walker Ambulation/Gait Assistance Details: cues for sequence, posture, position from RW and step to gait Gait Pattern: Step-to pattern Gait velocity: slowwww Stairs: No    Exercises Total Joint Exercises Ankle Circles/Pumps: AROM;15 reps;Supine;Both Quad Sets: AROM;Both;Supine;20 reps Heel Slides: AAROM;Supine;Right;20 reps Straight Leg Raises: AAROM;Right;Supine;20 reps;AROM Long Arc Quad: AROM;AAROM;10 reps;Seated;Both   PT Diagnosis:    PT Problem List:   PT Treatment Interventions:      PT Goals Acute Rehab PT Goals PT Goal Formulation: With patient Time For Goal Achievement: 07/26/12 Potential to Achieve Goals: Good Pt will go Supine/Side to Sit: with supervision PT Goal: Supine/Side to Sit - Progress: Progressing toward goal Pt will go Sit to Supine/Side: with supervision PT Goal: Sit to Supine/Side - Progress: Progressing toward goal Pt will go Sit to Stand: with supervision PT Goal: Sit to Stand - Progress: Progressing toward goal Pt will go Stand to Sit: with supervision PT Goal: Stand to Sit - Progress: Progressing toward goal Pt will Ambulate: 51 - 150 feet;with supervision;with rolling walker PT Goal: Ambulate - Progress: Progressing toward goal Pt will Go Up / Down Stairs: 3-5 stairs;with min assist;with least restrictive assistive device  Visit Information  Last PT Received On: 07/23/12 Assistance Needed: +1    Subjective Data  Subjective: If I do good today, I can go home this afternoon Patient Stated Goal: Get out in my yard   Cognition  Cognition Overall Cognitive Status: Appears within functional limits for tasks assessed/performed Arousal/Alertness: Awake/alert Orientation Level: Appears intact for tasks assessed Behavior During Session: [facility] for tasks performed    Balance     End of Session CPM Right Knee CPM Right Knee: Off   GP     Laketra Bowdish 07/23/2012, 10:29 AM

## 2012-11-14 ENCOUNTER — Emergency Department (HOSPITAL_BASED_OUTPATIENT_CLINIC_OR_DEPARTMENT_OTHER): Payer: Medicare Other

## 2012-11-14 ENCOUNTER — Encounter (HOSPITAL_BASED_OUTPATIENT_CLINIC_OR_DEPARTMENT_OTHER): Payer: Self-pay

## 2012-11-14 ENCOUNTER — Emergency Department (HOSPITAL_BASED_OUTPATIENT_CLINIC_OR_DEPARTMENT_OTHER)
Admission: EM | Admit: 2012-11-14 | Discharge: 2012-11-14 | Disposition: A | Discharge status: Home / Self Care | Payer: Medicare Other | Attending: Emergency Medicine | Admitting: Emergency Medicine

## 2012-11-14 DIAGNOSIS — S6991XA Unspecified injury of right wrist, hand and finger(s), initial encounter: Secondary | ICD-10-CM

## 2012-11-14 DIAGNOSIS — I4891 Unspecified atrial fibrillation: Secondary | ICD-10-CM | POA: Insufficient documentation

## 2012-11-14 DIAGNOSIS — E119 Type 2 diabetes mellitus without complications: Secondary | ICD-10-CM | POA: Insufficient documentation

## 2012-11-14 DIAGNOSIS — W100XXA Fall (on)(from) escalator, initial encounter: Secondary | ICD-10-CM | POA: Insufficient documentation

## 2012-11-14 DIAGNOSIS — Z880 Allergy status to penicillin: Secondary | ICD-10-CM | POA: Insufficient documentation

## 2012-11-14 DIAGNOSIS — Z862 Personal history of diseases of the blood and blood-forming organs and certain disorders involving the immune mechanism: Secondary | ICD-10-CM | POA: Insufficient documentation

## 2012-11-14 DIAGNOSIS — Z8739 Personal history of other diseases of the musculoskeletal system and connective tissue: Secondary | ICD-10-CM | POA: Insufficient documentation

## 2012-11-14 DIAGNOSIS — Z7901 Long term (current) use of anticoagulants: Secondary | ICD-10-CM | POA: Insufficient documentation

## 2012-11-14 DIAGNOSIS — Z8742 Personal history of other diseases of the female genital tract: Secondary | ICD-10-CM | POA: Insufficient documentation

## 2012-11-14 DIAGNOSIS — Z8669 Personal history of other diseases of the nervous system and sense organs: Secondary | ICD-10-CM | POA: Insufficient documentation

## 2012-11-14 DIAGNOSIS — Y9389 Activity, other specified: Secondary | ICD-10-CM | POA: Insufficient documentation

## 2012-11-14 DIAGNOSIS — Z8659 Personal history of other mental and behavioral disorders: Secondary | ICD-10-CM | POA: Insufficient documentation

## 2012-11-14 DIAGNOSIS — S6980XA Other specified injuries of unspecified wrist, hand and finger(s), initial encounter: Secondary | ICD-10-CM | POA: Insufficient documentation

## 2012-11-14 DIAGNOSIS — Z8639 Personal history of other endocrine, nutritional and metabolic disease: Secondary | ICD-10-CM | POA: Insufficient documentation

## 2012-11-14 DIAGNOSIS — I10 Essential (primary) hypertension: Secondary | ICD-10-CM | POA: Insufficient documentation

## 2012-11-14 DIAGNOSIS — IMO0002 Reserved for concepts with insufficient information to code with codable children: Secondary | ICD-10-CM

## 2012-11-14 DIAGNOSIS — S6990XA Unspecified injury of unspecified wrist, hand and finger(s), initial encounter: Secondary | ICD-10-CM | POA: Insufficient documentation

## 2012-11-14 DIAGNOSIS — G4730 Sleep apnea, unspecified: Secondary | ICD-10-CM | POA: Insufficient documentation

## 2012-11-14 DIAGNOSIS — Y9289 Other specified places as the place of occurrence of the external cause: Secondary | ICD-10-CM | POA: Insufficient documentation

## 2012-11-14 DIAGNOSIS — Z9981 Dependence on supplemental oxygen: Secondary | ICD-10-CM | POA: Insufficient documentation

## 2012-11-14 DIAGNOSIS — Z79899 Other long term (current) drug therapy: Secondary | ICD-10-CM | POA: Insufficient documentation

## 2012-11-14 MED ORDER — ACETAMINOPHEN 325 MG PO TABS
650.0000 mg | ORAL_TABLET | Freq: Once | ORAL | Status: AC
  Administered 2012-11-14: 650 mg via ORAL
  Filled 2012-11-14: qty 2

## 2012-11-14 NOTE — ED Notes (Signed)
Family at bedside. 

## 2012-11-14 NOTE — ED Notes (Signed)
Patient transported to X-ray 

## 2012-11-14 NOTE — ED Notes (Signed)
Fell on escalator in [place] approx 7pm-pain to left arm left leg and right thumb-skin tear noted to left elbow

## 2012-11-14 NOTE — ED Provider Notes (Signed)
History     CSN: [id number]  Arrival date & time 11/14/12  2043   First MD Initiated Contact with Patient 11/14/12 2124      Chief Complaint  Patient presents with  . Fall    (Consider location/radiation/quality/duration/timing/severity/associated sxs/prior treatment) HPI Patient fell on escalator at [place] just prior to arrival. She abraded her left forearm and her left thigh. She injured her right,. Did not strike her head or her trunk. She denies any headache, neck pain, chest pain, or abdominal pain. She is ambulatory with her cane. She is on Coumadin. Past Medical History  Diagnosis Date  . Arthritis   . Hypertension   . Anticoagulated on warfarin CHRONIC    HX PAF  . Osteoarthritis of knee BILATERAL  . Ankle edema LEFT  . Ureteral stone LEFT  . Macular degeneration of both eyes     RECEIVES AVASTIN INJECTION TO LEFT EYE EVERY 6-8 WKS  . Anxiety   . Atrial fibrillation CARDIOLOGIST- DR Gloris Manchester TURNER LAST VISIT 02-20-11 (WILL REQUEST NOTE, STRESS TEST AND ECHO)    UNSUCCESSFUL CARDIOVERSION IN 2010--- CHRONIC COUMADIN THERAPY  . Thyroid dysfunction     SIDE EFFECT OF AMIODARONE--PT STATES LAST BLOOD TEST SHOWED THYROID LEVELS NORMAL  . Sleep apnea     SETTING IS 4 ON CPAP  . Diabetes mellitus     ORAL MED TO "PREVENT DIABETES"--PT DOES NOT HAVE TO CHECK HER SUGARS    Past Surgical History  Procedure Laterality Date  . Abdominal hysterectomy  11-12-2000    S/  BSO  . Dilation and curettage of uterus  07-11-10  . Hysteroscopy w/d&c  08-29-2000    RESECTION POLYPS AND MYOMAS  . Cardioversion  11-29-08 FOR A-FIB  . Cystoscopy/retrograde/ureteroscopy  05/04/2011    Procedure: CYSTOSCOPY/RETROGRADE/URETEROSCOPY;  Surgeon: Lindaann Slough, MD;  Location: [facility];  Service: Urology;  Laterality: Left;  CYSTOSCOPY, RETROGRADE URETEROSCOPY, HOLMIUM LASER, JJ STENT, Basket stone extraction   . Cystoscopy with urethral dilatation  05/04/2011    Procedure:  CYSTOSCOPY WITH URETHRAL DILATATION;  Surgeon: Lindaann Slough, MD;  Location: [facility];  Service: Urology;;  . Cardioversion  09/18/2011    Procedure: CARDIOVERSION;  Surgeon: Quintella Reichert, MD;  Location: [facility] OR;  Service: Cardiovascular;  Laterality: N/A;  . Total knee arthroplasty Right 07/21/2012    Procedure: TOTAL KNEE ARTHROPLASTY;  Surgeon: Loanne Drilling, MD;  Location: [facility] ORS;  Service: Orthopedics;  Laterality: Right;    No family history on file.  History  Substance Use Topics  . Smoking status: Never Smoker   . Smokeless tobacco: Never Used  . Alcohol Use: No    OB History   Grav Para Term Preterm Abortions TAB SAB Ect Mult Living                  Review of Systems  All other systems reviewed and are negative.    Allergies  Penicillins  Home Medications   Current Outpatient Rx  Name  Route  Sig  Dispense  Refill  . Aflibercept 2 MG/0.05ML SOLN   Intraocular   Inject into the eye. INJECTION 2MG  TO LEFT EYE EVERY 8 WEEKS---AT DR. S[facility] [place].         Marland Kitchen diltiazem (CARDIZEM CD) 120 MG 24 hr capsule   Oral   Take 120 mg by mouth every morning.         . enoxaparin (LOVENOX) 30 MG/0.3ML injection   Subcutaneous   Inject 0.3  mLs (30 mg total) into the skin every 12 (twelve) hours. Continue Lovenox injections until the INR is therapeutic at or greater than 2.0.  When INR reaches the therapeutic level of equal to or greater than 2.0, the patient may discontinue the Lovenox injections.   3 Syringe   0   . furosemide (LASIX) 40 MG tablet   Oral   Take 40 mg by mouth every morning.          Marland Kitchen losartan-hydrochlorothiazide (HYZAAR) 100-12.5 MG per tablet   Oral   Take 1 tablet by mouth daily. TAKES IN AM         . metFORMIN (GLUCOPHAGE) 500 MG tablet   Oral   Take 500 mg by mouth daily with breakfast.          . methocarbamol (ROBAXIN) 500 MG tablet   Oral   Take 1 tablet (500 mg total) by mouth every 6  (six) hours as needed.   80 tablet   0   . metoprolol (LOPRESSOR) 50 MG tablet   Oral   Take 25 mg by mouth 2 (two) times daily.         Marland Kitchen oxyCODONE (OXY IR/ROXICODONE) 5 MG immediate release tablet   Oral   Take 1-2 tablets (5-10 mg total) by mouth every 3 (three) hours as needed.   80 tablet   0   . potassium chloride (K-DUR,KLOR-CON) 10 MEQ tablet   Oral   Take 10 mEq by mouth 3 (three) times daily with meals.          . potassium citrate (UROCIT-K) 10 MEQ (1080 MG) SR tablet   Oral   Take 10 mEq by mouth 3 (three) times daily with meals.         . traMADol-acetaminophen (ULTRACET) 37.5-325 MG per tablet   Oral   Take 1 tablet by mouth every 6 (six) hours as needed. For pain         . warfarin (COUMADIN) 5 MG tablet   Oral   Take 0.5-1 tablets (2.5-5 mg total) by mouth daily. Take Coumadin for 3 weeks for postoperative protocol, then patient may resume their previous Coumadin home regimen.  The dose may need to be adjusted based upon the INR.  Please follow the INR and titrate Coumadin dose for a therapeutic range between 2.0 and 3.0 INR.  After completing the three weeks of Coumadin, the patient may resume their previous Coumadin home regimen.   50 tablet   0     BP 170/87  Pulse 77  Temp(Src) 97.9 F (36.6 C) (Oral)  Resp 16  Ht 5\' 5"  (1.651 m)  Wt 206 lb (93.441 kg)  BMI 34.28 kg/m2  SpO2 100%  Physical Exam  Nursing note and vitals reviewed. Constitutional: She is oriented to person, place, and time. She appears well-developed and well-nourished.  HENT:  Head: Normocephalic and atraumatic.  Right Ear: External ear normal.  Left Ear: External ear normal.  Mouth/Throat: Oropharynx is clear and moist.  Eyes: Conjunctivae and EOM are normal. Pupils are equal, round, and reactive to light.  Neck: Normal range of motion.  Cardiovascular: Normal rate, regular rhythm, normal heart sounds and intact distal pulses.   Pulmonary/Chest: Effort normal and  breath sounds normal.  Abdominal: Soft. Bowel sounds are normal.  Musculoskeletal:  Skin tear and abrasions left forearm abrasion left thigh Tenderness and swelling inter-phalangeal joint of right thumb Full active range of motion of bilateral shoulders, elbows, wrists, hips, and knees.  Neurological: She is alert and oriented to person, place, and time. She has normal reflexes. She displays normal reflexes. No cranial nerve deficit. Coordination normal.  Skin: Skin is warm and dry.  Psychiatric: She has a normal mood and affect. Her behavior is normal. Thought content normal.    ED Course  Procedures (including critical care time)  Labs Reviewed - No data to display Dg Finger Thumb Right  11/14/2012   *RADIOLOGY REPORT*  Clinical Data: Pain post fall  RIGHT THUMB 2+V  Comparison: None.  Findings: Corticated ossicles at the dorsal aspect of the right first MCP and PIP joints.  No definite acute fracture.  No dislocation.  Degenerative changes at the right thumb interphalangeal, first MCP, and first carpometacarpal articulation. Patchy arterial calcifications.  Normal mineralization and alignment.  Osteoarthritic changes in the interphalangeal joint of the little finger seen on the lateral projection.  IMPRESSION:  1.  Negative for fracture or other acute bony abnormality. 2.  Osteoarthritis as above   Original Report Authenticated By: D. Andria Rhein, MD     No diagnosis found.    MDM  Patient: Escalator and on Coumadin. She has abrasions of her left upper extremity and left lower extremity. She has a skin tear of the left upper extremity. She is unclear as to her tetanus status but thinks that she had it last year. The plan is to have the patient check with her primary care physician on Monday and she'll have her keep them updated if it is needed at that time. She has a sprain and contusion of the right thumb but no evidence of fracture is seen. Patient was given Tylenol here for pain. She  is advised of the above findings and voices understanding and agreement       Hilario Quarry, MD 11/14/12 2309

## 2012-11-14 NOTE — ED Notes (Signed)
Patient is resting comfortably. 

## 2012-11-27 ENCOUNTER — Other Ambulatory Visit: Payer: Self-pay | Admitting: Orthopedic Surgery

## 2012-12-11 NOTE — Progress Notes (Signed)
Need orders please - Pt coming for PREOP TUES 12/16/12 - thank you

## 2012-12-12 ENCOUNTER — Encounter (HOSPITAL_COMMUNITY): Payer: Self-pay | Admitting: Pharmacy Technician

## 2012-12-14 ENCOUNTER — Other Ambulatory Visit: Payer: Self-pay | Admitting: Orthopedic Surgery

## 2012-12-14 MED ORDER — DEXAMETHASONE SODIUM PHOSPHATE 10 MG/ML IJ SOLN: 10.0000 mg | Freq: Once | INTRAMUSCULAR | Status: DC

## 2012-12-14 MED ORDER — BUPIVACAINE LIPOSOME 1.3 % IJ SUSP: 20.0000 mL | Freq: Once | INTRAMUSCULAR | Status: DC

## 2012-12-14 NOTE — Progress Notes (Signed)
Preoperative surgical orders have been place into the Epic hospital system for [name] on 12/14/2012, 10:27 PM  by Patrica Duel for surgery on 12/22/2012.  Preop Total Knee orders including Experal, PO Tylenol, and IV Decadron as long as there are no contraindications to the above medications. [name], PA-C

## 2012-12-16 ENCOUNTER — Encounter (HOSPITAL_COMMUNITY)
Admission: RE | Admit: 2012-12-16 | Discharge: 2012-12-16 | Disposition: A | Discharge status: Home / Self Care | Payer: Medicare Other | Source: Ambulatory Visit | Attending: Orthopedic Surgery | Admitting: Orthopedic Surgery

## 2012-12-16 ENCOUNTER — Encounter (HOSPITAL_COMMUNITY): Payer: Self-pay

## 2012-12-16 DIAGNOSIS — Z01812 Encounter for preprocedural laboratory examination: Secondary | ICD-10-CM | POA: Insufficient documentation

## 2012-12-16 HISTORY — DX: Personal history of urinary calculi: Z87.442

## 2012-12-16 LAB — PROTIME-INR
INR: 2.23 — ABNORMAL HIGH (ref 0.00–1.49)
Prothrombin Time: 24 s — ABNORMAL HIGH (ref 11.6–15.2)

## 2012-12-16 LAB — COMPREHENSIVE METABOLIC PANEL
ALT: 16 U/L (ref 0–35)
AST: 18 U/L (ref 0–37)
Alkaline Phosphatase: 86 U/L (ref 39–117)
CO2: 27 mEq/L (ref 19–32)
Calcium: 9.7 mg/dL (ref 8.4–10.5)
Chloride: 97 mEq/L (ref 96–112)
GFR calc Af Amer: >90 mL/min (ref >90)
GFR calc non Af Amer: >90 mL/min (ref >90)
Glucose, Bld: 107 mg/dL — ABNORMAL HIGH (ref 70–99)
Sodium: 134 mEq/L — ABNORMAL LOW (ref 135–145)
Total Bilirubin: 0.7 mg/dL (ref 0.3–1.2)

## 2012-12-16 LAB — URINE MICROSCOPIC-ADD ON

## 2012-12-16 LAB — CBC
Hemoglobin: 13.7 g/dL (ref 12.0–15.0)
MCH: 30.2 pg (ref 26.0–34.0)
MCV: 86.6 fL (ref 78.0–100.0)
Platelets: 275 10*3/uL (ref 150–400)
RBC: 4.54 MIL/uL (ref 3.87–5.11)
WBC: 7.1 10*3/uL (ref 4.0–10.5)

## 2012-12-16 LAB — URINALYSIS, ROUTINE W REFLEX MICROSCOPIC
Glucose, UA: NEGATIVE mg/dL
Hgb urine dipstick: NEGATIVE
Protein, ur: NEGATIVE mg/dL
Specific Gravity, Urine: 1.023 (ref 1.005–1.030)
pH: 7.5 (ref 5.0–8.0)

## 2012-12-16 LAB — SURGICAL PCR SCREEN
MRSA, PCR: NEGATIVE
Staphylococcus aureus: NEGATIVE

## 2012-12-16 NOTE — Progress Notes (Signed)
Abnormal UA faxed to Dr.Aluisio 

## 2012-12-16 NOTE — Patient Instructions (Addendum)
[name]  12/16/2012                           YOUR PROCEDURE IS SCHEDULED ON: 12/22/12               PLEASE REPORT TO SHORT STAY CENTER AT :  6:00 AM               CALL THIS NUMBER IF ANY PROBLEMS THE DAY OF SURGERY :               [phone]                      REMEMBER:   Do not eat food or drink liquids AFTER MIDNIGHT             Take these medicines the morning of surgery with A SIP OF WATER:  DILTIAZEM / METOPROLOL   Do not wear jewelry, make-up   Do not wear lotions, powders, or perfumes.   Do not shave legs or underarms 12 hrs. before surgery (men may shave face)  Do not bring valuables to the hospital.  Contacts, dentures or bridgework may not be worn into surgery.  Leave suitcase in the car. After surgery it may be brought to your room.  For patients admitted to the hospital more than one night, checkout time is 11:00                          The day of discharge.   Patients discharged the day of surgery will not be allowed to drive home                             If going home same day of surgery, must have someone stay with you first                           24 hrs at home and arrange for some one to drive you home from hospital.    Special Instructions:   Please read over the following fact sheets that you were given:               1. MRSA  INFORMATION                      2. [place] PREPARING FOR SURGERY SHEET               3. INCENTIVE SPIROMETER               4. BRING C-PAP MASK AND TUBING TO HOSPITAL                                                X_____________________________________________________________________        Failure to follow these instructions may result in cancellation of your surgery

## 2012-12-17 LAB — URINE CULTURE

## 2012-12-22 ENCOUNTER — Encounter (HOSPITAL_COMMUNITY): Payer: Self-pay | Admitting: Anesthesiology

## 2012-12-22 ENCOUNTER — Inpatient Hospital Stay (HOSPITAL_COMMUNITY): Payer: Medicare Other | Admitting: Anesthesiology

## 2012-12-22 ENCOUNTER — Encounter (HOSPITAL_COMMUNITY)
Admission: RE | Disposition: A | Discharge status: Home Health | Payer: Self-pay | Source: Ambulatory Visit | Attending: Orthopedic Surgery

## 2012-12-22 ENCOUNTER — Encounter (HOSPITAL_COMMUNITY): Payer: Self-pay | Admitting: *Deleted

## 2012-12-22 ENCOUNTER — Inpatient Hospital Stay (HOSPITAL_COMMUNITY)
Admission: RE | Admit: 2012-12-22 | Discharge: 2012-12-24 | DRG: 470 | Disposition: A | Discharge status: Home Health | Payer: Medicare Other | Source: Ambulatory Visit | Attending: Orthopedic Surgery | Admitting: Orthopedic Surgery

## 2012-12-22 DIAGNOSIS — Z79899 Other long term (current) drug therapy: Secondary | ICD-10-CM

## 2012-12-22 DIAGNOSIS — Z880 Allergy status to penicillin: Secondary | ICD-10-CM

## 2012-12-22 DIAGNOSIS — Z96652 Presence of left artificial knee joint: Secondary | ICD-10-CM

## 2012-12-22 DIAGNOSIS — F411 Generalized anxiety disorder: Secondary | ICD-10-CM | POA: Diagnosis present

## 2012-12-22 DIAGNOSIS — H3530 Unspecified macular degeneration: Secondary | ICD-10-CM | POA: Diagnosis present

## 2012-12-22 DIAGNOSIS — G4730 Sleep apnea, unspecified: Secondary | ICD-10-CM

## 2012-12-22 DIAGNOSIS — I4891 Unspecified atrial fibrillation: Secondary | ICD-10-CM | POA: Diagnosis present

## 2012-12-22 DIAGNOSIS — E871 Hypo-osmolality and hyponatremia: Secondary | ICD-10-CM

## 2012-12-22 DIAGNOSIS — Z87442 Personal history of urinary calculi: Secondary | ICD-10-CM

## 2012-12-22 DIAGNOSIS — I10 Essential (primary) hypertension: Secondary | ICD-10-CM

## 2012-12-22 DIAGNOSIS — E119 Type 2 diabetes mellitus without complications: Secondary | ICD-10-CM

## 2012-12-22 DIAGNOSIS — D62 Acute posthemorrhagic anemia: Secondary | ICD-10-CM

## 2012-12-22 DIAGNOSIS — M1710 Unilateral primary osteoarthritis, unspecified knee: Principal | ICD-10-CM | POA: Diagnosis present

## 2012-12-22 HISTORY — PX: TOTAL KNEE ARTHROPLASTY: SHX125

## 2012-12-22 LAB — PROTIME-INR: INR: 1.04 (ref 0.00–1.49)

## 2012-12-22 LAB — GLUCOSE, CAPILLARY
Glucose-Capillary: 114 mg/dL — ABNORMAL HIGH (ref 70–99)
Glucose-Capillary: 112 mg/dL — ABNORMAL HIGH (ref 70–99)
Glucose-Capillary: 192 mg/dL — ABNORMAL HIGH (ref 70–99)
Glucose-Capillary: 134 mg/dL — ABNORMAL HIGH (ref 70–99)

## 2012-12-22 SURGERY — ARTHROPLASTY, KNEE, TOTAL
Anesthesia: Spinal | Site: Knee | Laterality: Left

## 2012-12-22 MED ORDER — ACETAMINOPHEN 500 MG PO TABS
1000.0000 mg | ORAL_TABLET | Freq: Once | ORAL | Status: AC
  Administered 2012-12-22: 1000 mg via ORAL
  Filled 2012-12-22: qty 2

## 2012-12-22 MED ORDER — DEXAMETHASONE 4 MG PO TABS
10.0000 mg | ORAL_TABLET | Freq: Every day | ORAL | Status: AC
  Administered 2012-12-23: 10 mg via ORAL
  Filled 2012-12-22: qty 1

## 2012-12-22 MED ORDER — HYDROCHLOROTHIAZIDE 12.5 MG PO CAPS
12.5000 mg | ORAL_CAPSULE | Freq: Every day | ORAL | Status: DC
  Administered 2012-12-23 – 2012-12-24 (×2): 12.5 mg via ORAL
  Filled 2012-12-22 (×3): qty 1

## 2012-12-22 MED ORDER — BISACODYL 10 MG RE SUPP: 10.0000 mg | Freq: Every day | RECTAL | Status: DC | PRN

## 2012-12-22 MED ORDER — CELECOXIB 200 MG PO CAPS
200.0000 mg | ORAL_CAPSULE | Freq: Once | ORAL | Status: AC
  Administered 2012-12-22: 200 mg via ORAL
  Filled 2012-12-22: qty 1

## 2012-12-22 MED ORDER — SODIUM CHLORIDE 0.9 % IV SOLN: INTRAVENOUS | Status: DC

## 2012-12-22 MED ORDER — METHOCARBAMOL 500 MG PO TABS
500.0000 mg | ORAL_TABLET | Freq: Four times a day (QID) | ORAL | Status: DC | PRN
  Administered 2012-12-22 – 2012-12-24 (×3): 500 mg via ORAL
  Filled 2012-12-22 (×3): qty 1

## 2012-12-22 MED ORDER — POTASSIUM CHLORIDE IN NACL 20-0.9 MEQ/L-% IV SOLN
INTRAVENOUS | Status: DC
  Administered 2012-12-22 – 2012-12-23 (×2): via INTRAVENOUS
  Filled 2012-12-22 (×3): qty 1000

## 2012-12-22 MED ORDER — WARFARIN - PHARMACIST DOSING INPATIENT: Freq: Every day | Status: DC

## 2012-12-22 MED ORDER — OXYCODONE HCL 5 MG PO TABS
5.0000 mg | ORAL_TABLET | ORAL | Status: DC | PRN
  Administered 2012-12-22: 5 mg via ORAL
  Administered 2012-12-22 (×2): 10 mg via ORAL
  Administered 2012-12-23: 5 mg via ORAL
  Administered 2012-12-23 – 2012-12-24 (×6): 10 mg via ORAL
  Filled 2012-12-22: qty 2
  Filled 2012-12-22: qty 1
  Filled 2012-12-22 (×6): qty 2
  Filled 2012-12-22: qty 1
  Filled 2012-12-22: qty 2

## 2012-12-22 MED ORDER — METOPROLOL TARTRATE 25 MG PO TABS
25.0000 mg | ORAL_TABLET | Freq: Two times a day (BID) | ORAL | Status: DC
  Administered 2012-12-22 – 2012-12-24 (×5): 25 mg via ORAL
  Filled 2012-12-22 (×6): qty 1

## 2012-12-22 MED ORDER — METFORMIN HCL ER 500 MG PO TB24
500.0000 mg | ORAL_TABLET | Freq: Every day | ORAL | Status: DC
  Administered 2012-12-24: 500 mg via ORAL
  Filled 2012-12-22 (×2): qty 1

## 2012-12-22 MED ORDER — LACTATED RINGERS IV SOLN
INTRAVENOUS | Status: DC | PRN
  Administered 2012-12-22 (×2): via INTRAVENOUS

## 2012-12-22 MED ORDER — PROPOFOL INFUSION 10 MG/ML OPTIME
INTRAVENOUS | Status: DC | PRN
  Administered 2012-12-22: 100 ug/kg/min via INTRAVENOUS

## 2012-12-22 MED ORDER — ENOXAPARIN SODIUM 30 MG/0.3ML ~~LOC~~ SOLN
30.0000 mg | Freq: Two times a day (BID) | SUBCUTANEOUS | Status: DC
  Administered 2012-12-23 – 2012-12-24 (×3): 30 mg via SUBCUTANEOUS
  Filled 2012-12-22 (×6): qty 0.3

## 2012-12-22 MED ORDER — INSULIN ASPART 100 UNIT/ML ~~LOC~~ SOLN
0.0000 [IU] | Freq: Three times a day (TID) | SUBCUTANEOUS | Status: DC
  Administered 2012-12-23: 2 [IU] via SUBCUTANEOUS
  Administered 2012-12-23: 3 [IU] via SUBCUTANEOUS
  Administered 2012-12-23: 5 [IU] via SUBCUTANEOUS
  Administered 2012-12-24 (×2): 3 [IU] via SUBCUTANEOUS

## 2012-12-22 MED ORDER — ONDANSETRON HCL 4 MG/2ML IJ SOLN: 4.0000 mg | Freq: Four times a day (QID) | INTRAMUSCULAR | Status: DC | PRN

## 2012-12-22 MED ORDER — MORPHINE SULFATE 2 MG/ML IJ SOLN
1.0000 mg | INTRAMUSCULAR | Status: DC | PRN
  Filled 2012-12-22: qty 1

## 2012-12-22 MED ORDER — CEFAZOLIN SODIUM-DEXTROSE 2-3 GM-% IV SOLR
2.0000 g | INTRAVENOUS | Status: AC
  Administered 2012-12-22: 2 g via INTRAVENOUS

## 2012-12-22 MED ORDER — METHOCARBAMOL 100 MG/ML IJ SOLN
500.0000 mg | Freq: Four times a day (QID) | INTRAVENOUS | Status: DC | PRN
  Filled 2012-12-22: qty 5

## 2012-12-22 MED ORDER — POTASSIUM CITRATE ER 10 MEQ (1080 MG) PO TBCR
10.0000 meq | EXTENDED_RELEASE_TABLET | Freq: Three times a day (TID) | ORAL | Status: DC
  Administered 2012-12-22 – 2012-12-24 (×7): 10 meq via ORAL
  Filled 2012-12-22 (×9): qty 1

## 2012-12-22 MED ORDER — CEFAZOLIN SODIUM 1-5 GM-% IV SOLN
1.0000 g | Freq: Four times a day (QID) | INTRAVENOUS | Status: AC
  Administered 2012-12-22 (×2): 1 g via INTRAVENOUS
  Filled 2012-12-22 (×2): qty 50

## 2012-12-22 MED ORDER — OXYCODONE HCL 5 MG/5ML PO SOLN
5.0000 mg | Freq: Once | ORAL | Status: DC | PRN
  Filled 2012-12-22: qty 5

## 2012-12-22 MED ORDER — METOCLOPRAMIDE HCL 5 MG/ML IJ SOLN: 5.0000 mg | Freq: Three times a day (TID) | INTRAMUSCULAR | Status: DC | PRN

## 2012-12-22 MED ORDER — BUPIVACAINE HCL 0.25 % IJ SOLN
INTRAMUSCULAR | Status: DC | PRN
  Administered 2012-12-22: 20 mL

## 2012-12-22 MED ORDER — TRAMADOL HCL 50 MG PO TABS: 50.0000 mg | ORAL_TABLET | Freq: Four times a day (QID) | ORAL | Status: DC | PRN

## 2012-12-22 MED ORDER — AFLIBERCEPT 2 MG/0.05ML IO SOLN: 2.0000 mg | INTRAOCULAR | Status: DC

## 2012-12-22 MED ORDER — BUPIVACAINE LIPOSOME 1.3 % IJ SUSP
20.0000 mL | Freq: Once | INTRAMUSCULAR | Status: DC
  Filled 2012-12-22: qty 20

## 2012-12-22 MED ORDER — PHENYLEPHRINE HCL 10 MG/ML IJ SOLN
INTRAMUSCULAR | Status: DC | PRN
  Administered 2012-12-22 (×3): 80 ug via INTRAVENOUS

## 2012-12-22 MED ORDER — FUROSEMIDE 40 MG PO TABS
40.0000 mg | ORAL_TABLET | Freq: Every morning | ORAL | Status: DC
  Administered 2012-12-22 – 2012-12-24 (×3): 40 mg via ORAL
  Filled 2012-12-22 (×3): qty 1

## 2012-12-22 MED ORDER — DEXAMETHASONE SODIUM PHOSPHATE 10 MG/ML IJ SOLN
10.0000 mg | Freq: Every day | INTRAMUSCULAR | Status: AC
  Filled 2012-12-22: qty 1

## 2012-12-22 MED ORDER — MIDAZOLAM HCL 5 MG/5ML IJ SOLN
INTRAMUSCULAR | Status: DC | PRN
  Administered 2012-12-22: 2 mg via INTRAVENOUS

## 2012-12-22 MED ORDER — SODIUM CHLORIDE 0.9 % IV BOLUS (SEPSIS)
250.0000 mL | Freq: Once | INTRAVENOUS | Status: AC
  Administered 2012-12-22: 250 mL via INTRAVENOUS

## 2012-12-22 MED ORDER — MEPERIDINE HCL 50 MG/ML IJ SOLN: 6.2500 mg | INTRAMUSCULAR | Status: DC | PRN

## 2012-12-22 MED ORDER — PHENOL 1.4 % MT LIQD
1.0000 | OROMUCOSAL | Status: DC | PRN
  Filled 2012-12-22: qty 177

## 2012-12-22 MED ORDER — MENTHOL 3 MG MT LOZG: 1.0000 | LOZENGE | OROMUCOSAL | Status: DC | PRN

## 2012-12-22 MED ORDER — ACETAMINOPHEN 500 MG PO TABS
1000.0000 mg | ORAL_TABLET | Freq: Four times a day (QID) | ORAL | Status: AC
  Administered 2012-12-22 – 2012-12-23 (×4): 1000 mg via ORAL
  Filled 2012-12-22 (×4): qty 2

## 2012-12-22 MED ORDER — PROMETHAZINE HCL 25 MG/ML IJ SOLN: 6.2500 mg | INTRAMUSCULAR | Status: DC | PRN

## 2012-12-22 MED ORDER — HYDROMORPHONE HCL PF 1 MG/ML IJ SOLN: 0.2500 mg | INTRAMUSCULAR | Status: DC | PRN

## 2012-12-22 MED ORDER — MORPHINE SULFATE 2 MG/ML IJ SOLN
INTRAMUSCULAR | Status: AC
  Administered 2012-12-22: 1 mg via INTRAVENOUS
  Filled 2012-12-22: qty 1

## 2012-12-22 MED ORDER — ONDANSETRON HCL 4 MG PO TABS
4.0000 mg | ORAL_TABLET | Freq: Four times a day (QID) | ORAL | Status: DC | PRN
  Administered 2012-12-22: 4 mg via ORAL
  Filled 2012-12-22: qty 1

## 2012-12-22 MED ORDER — DILTIAZEM HCL ER COATED BEADS 120 MG PO CP24
120.0000 mg | ORAL_CAPSULE | Freq: Every morning | ORAL | Status: DC
  Administered 2012-12-22 – 2012-12-24 (×3): 120 mg via ORAL
  Filled 2012-12-22 (×3): qty 1

## 2012-12-22 MED ORDER — DIPHENHYDRAMINE HCL 12.5 MG/5ML PO ELIX: 12.5000 mg | ORAL_SOLUTION | ORAL | Status: DC | PRN

## 2012-12-22 MED ORDER — CHLORHEXIDINE GLUCONATE 4 % EX LIQD
60.0000 mL | Freq: Once | CUTANEOUS | Status: DC
  Filled 2012-12-22: qty 60

## 2012-12-22 MED ORDER — LOSARTAN POTASSIUM 50 MG PO TABS
100.0000 mg | ORAL_TABLET | Freq: Every day | ORAL | Status: DC
  Administered 2012-12-23 – 2012-12-24 (×2): 100 mg via ORAL
  Filled 2012-12-22 (×3): qty 2

## 2012-12-22 MED ORDER — BUPIVACAINE LIPOSOME 1.3 % IJ SUSP
INTRAMUSCULAR | Status: DC | PRN
  Administered 2012-12-22: 20 mL

## 2012-12-22 MED ORDER — LOSARTAN POTASSIUM-HCTZ 100-12.5 MG PO TABS: 1.0000 | ORAL_TABLET | Freq: Every morning | ORAL | Status: DC

## 2012-12-22 MED ORDER — TRANEXAMIC ACID 100 MG/ML IV SOLN
1000.0000 mg | INTRAVENOUS | Status: AC
  Administered 2012-12-22: 1000 mg via INTRAVENOUS
  Filled 2012-12-22: qty 10

## 2012-12-22 MED ORDER — ACETAMINOPHEN 650 MG RE SUPP: 650.0000 mg | Freq: Four times a day (QID) | RECTAL | Status: AC

## 2012-12-22 MED ORDER — FLEET ENEMA 7-19 GM/118ML RE ENEM: 1.0000 | ENEMA | Freq: Once | RECTAL | Status: AC | PRN

## 2012-12-22 MED ORDER — FENTANYL CITRATE 0.05 MG/ML IJ SOLN
INTRAMUSCULAR | Status: DC | PRN
  Administered 2012-12-22: 100 ug via INTRAVENOUS

## 2012-12-22 MED ORDER — SODIUM CHLORIDE 0.9 % IJ SOLN
INTRAMUSCULAR | Status: DC | PRN
  Administered 2012-12-22: 30 mL via INTRAVENOUS

## 2012-12-22 MED ORDER — POLYETHYLENE GLYCOL 3350 17 G PO PACK: 17.0000 g | PACK | Freq: Every day | ORAL | Status: DC | PRN

## 2012-12-22 MED ORDER — WARFARIN SODIUM 7.5 MG PO TABS
7.5000 mg | ORAL_TABLET | Freq: Once | ORAL | Status: AC
  Administered 2012-12-22: 7.5 mg via ORAL
  Filled 2012-12-22: qty 1

## 2012-12-22 MED ORDER — OXYCODONE HCL 5 MG PO TABS: 5.0000 mg | ORAL_TABLET | Freq: Once | ORAL | Status: DC | PRN

## 2012-12-22 MED ORDER — METOCLOPRAMIDE HCL 10 MG PO TABS: 5.0000 mg | ORAL_TABLET | Freq: Three times a day (TID) | ORAL | Status: DC | PRN

## 2012-12-22 MED ORDER — DEXTROSE 5 % IV SOLN
10.0000 mg | INTRAVENOUS | Status: DC | PRN
  Administered 2012-12-22: 10 ug/min via INTRAVENOUS

## 2012-12-22 MED ORDER — KETOROLAC TROMETHAMINE 15 MG/ML IJ SOLN
7.5000 mg | Freq: Four times a day (QID) | INTRAMUSCULAR | Status: AC | PRN
  Administered 2012-12-22: 7.5 mg via INTRAVENOUS

## 2012-12-22 MED ORDER — DOCUSATE SODIUM 100 MG PO CAPS
100.0000 mg | ORAL_CAPSULE | Freq: Two times a day (BID) | ORAL | Status: DC
  Administered 2012-12-22 – 2012-12-24 (×4): 100 mg via ORAL

## 2012-12-22 SURGICAL SUPPLY — 56 items
BAG ZIPLOCK 12X15 (MISCELLANEOUS) ×2 IMPLANT
BANDAGE ELASTIC 6 VELCRO ST LF (GAUZE/BANDAGES/DRESSINGS) ×2 IMPLANT
BANDAGE ESMARK 6X9 LF (GAUZE/BANDAGES/DRESSINGS) ×1 IMPLANT
BLADE SAG 18X100X1.27 (BLADE) ×2 IMPLANT
BLADE SAW SGTL 11.0X1.19X90.0M (BLADE) ×2 IMPLANT
BNDG ESMARK 6X9 LF (GAUZE/BANDAGES/DRESSINGS) ×2
BOWL SMART MIX CTS (DISPOSABLE) ×2 IMPLANT
CAPT RP KNEE ×2 IMPLANT
CEMENT HV SMART SET (Cement) ×4 IMPLANT
CLOSURE STERI-STRIP 1/4X4 (GAUZE/BANDAGES/DRESSINGS) ×2 IMPLANT
CLOTH BEACON ORANGE TIMEOUT ST (SAFETY) ×2 IMPLANT
CUFF TOURN SGL QUICK 34 (TOURNIQUET CUFF) ×1
CUFF TRNQT CYL 34X4X40X1 (TOURNIQUET CUFF) ×1 IMPLANT
DECANTER SPIKE VIAL GLASS SM (MISCELLANEOUS) ×2 IMPLANT
DRAPE EXTREMITY T 121X128X90 (DRAPE) ×2 IMPLANT
DRAPE POUCH INSTRU U-SHP 10X18 (DRAPES) ×2 IMPLANT
DRAPE U-SHAPE 47X51 STRL (DRAPES) ×2 IMPLANT
DRSG ADAPTIC 3X8 NADH LF (GAUZE/BANDAGES/DRESSINGS) ×2 IMPLANT
DRSG PAD ABDOMINAL 8X10 ST (GAUZE/BANDAGES/DRESSINGS) ×2 IMPLANT
DURAPREP 26ML APPLICATOR (WOUND CARE) ×2 IMPLANT
ELECT REM PT RETURN 9FT ADLT (ELECTROSURGICAL) ×2
ELECTRODE REM PT RTRN 9FT ADLT (ELECTROSURGICAL) ×1 IMPLANT
EVACUATOR 1/8 PVC DRAIN (DRAIN) ×2 IMPLANT
FACESHIELD LNG OPTICON STERILE (SAFETY) ×10 IMPLANT
GLOVE BIO SURGEON STRL SZ7.5 (GLOVE) ×2 IMPLANT
GLOVE BIO SURGEON STRL SZ8 (GLOVE) ×2 IMPLANT
GLOVE BIOGEL PI IND STRL 8 (GLOVE) ×1 IMPLANT
GLOVE BIOGEL PI INDICATOR 8 (GLOVE) ×1
GLOVE SURG SS PI 6.5 STRL IVOR (GLOVE) IMPLANT
GOWN STRL NON-REIN LRG LVL3 (GOWN DISPOSABLE) ×4 IMPLANT
GOWN STRL REIN XL XLG (GOWN DISPOSABLE) ×4 IMPLANT
HANDPIECE INTERPULSE COAX TIP (DISPOSABLE) ×1
IMMOBILIZER KNEE 20 (SOFTGOODS) ×2
IMMOBILIZER KNEE 20 THIGH 36 (SOFTGOODS) ×1 IMPLANT
KIT BASIN OR (CUSTOM PROCEDURE TRAY) ×2 IMPLANT
MANIFOLD NEPTUNE II (INSTRUMENTS) ×2 IMPLANT
NDL SAFETY ECLIPSE 18X1.5 (NEEDLE) ×2 IMPLANT
NEEDLE HYPO 18GX1.5 SHARP (NEEDLE) ×2
NS IRRIG 1000ML POUR BTL (IV SOLUTION) ×2 IMPLANT
PACK TOTAL JOINT (CUSTOM PROCEDURE TRAY) ×2 IMPLANT
PADDING CAST COTTON 6X4 STRL (CAST SUPPLIES) ×6 IMPLANT
POSITIONER SURGICAL ARM (MISCELLANEOUS) ×2 IMPLANT
SET HNDPC FAN SPRY TIP SCT (DISPOSABLE) ×1 IMPLANT
SPONGE GAUZE 4X4 12PLY (GAUZE/BANDAGES/DRESSINGS) ×2 IMPLANT
STRIP CLOSURE SKIN 1/2X4 (GAUZE/BANDAGES/DRESSINGS) ×4 IMPLANT
SUCTION FRAZIER 12FR DISP (SUCTIONS) ×2 IMPLANT
SUT MNCRL AB 4-0 PS2 18 (SUTURE) ×2 IMPLANT
SUT VIC AB 2-0 CT1 27 (SUTURE) ×3
SUT VIC AB 2-0 CT1 TAPERPNT 27 (SUTURE) ×3 IMPLANT
SUT VLOC 180 0 24IN GS25 (SUTURE) ×2 IMPLANT
SYR 20CC LL (SYRINGE) ×2 IMPLANT
SYR 50ML LL SCALE MARK (SYRINGE) ×2 IMPLANT
TOWEL OR 17X26 10 PK STRL BLUE (TOWEL DISPOSABLE) ×4 IMPLANT
TRAY FOLEY CATH 14FRSI W/METER (CATHETERS) ×2 IMPLANT
WATER STERILE IRR 1500ML POUR (IV SOLUTION) ×2 IMPLANT
WRAP KNEE MAXI GEL POST OP (GAUZE/BANDAGES/DRESSINGS) ×2 IMPLANT

## 2012-12-22 NOTE — Transfer of Care (Signed)
Immediate Anesthesia Transfer of Care Note  Patient: [name]  Procedure(s) Performed: Procedure(s): LEFT TOTAL KNEE ARTHROPLASTY (Left)  Patient Location: PACU  Anesthesia Type:Spinal  Level of Consciousness: awake, alert , oriented and patient cooperative  Airway & Oxygen Therapy: Patient Spontanous Breathing and Patient connected to face mask oxygen  Post-op Assessment: Report given to PACU RN, Post -op Vital signs reviewed and stable and Patient moving all extremities X 4  Post vital signs: stable  Complications: No apparent anesthesia complications

## 2012-12-22 NOTE — Anesthesia Postprocedure Evaluation (Signed)
Anesthesia Post Note  Patient: [name]  Procedure(s) Performed: Procedure(s) (LRB): LEFT TOTAL KNEE ARTHROPLASTY (Left)  Anesthesia type: Spinal  Patient location: PACU  Post pain: Pain level controlled  Post assessment: Post-op Vital signs reviewed  Last Vitals: BP 130/88  Pulse 91  Temp(Src) 36.4 C (Oral)  Resp 14  SpO2 100%  Post vital signs: Reviewed  Level of consciousness: sedated  Complications: No apparent anesthesia complications

## 2012-12-22 NOTE — Anesthesia Preprocedure Evaluation (Addendum)
Anesthesia Evaluation  Patient identified by MRN, date of birth, ID band Patient awake    Reviewed: Allergy & Precautions, H&P , NPO status , Patient's Chart, lab work & pertinent test results, reviewed documented beta blocker date and time   Airway Mallampati: II TM Distance: >3 FB Neck ROM: full    Dental  (+) Dental Advisory Given   Pulmonary sleep apnea and Continuous Positive Airway Pressure Ventilation ,  breath sounds clear to auscultation        Cardiovascular hypertension, On Medications and On Home Beta Blockers + dysrhythmias Atrial Fibrillation     Neuro/Psych PSYCHIATRIC DISORDERS Anxiety negative neurological ROS     GI/Hepatic negative GI ROS, Neg liver ROS,   Endo/Other  diabetes, Type 2, Oral Hypoglycemic Agents  Renal/GU negative Renal ROS     Musculoskeletal  (+) Arthritis -, Osteoarthritis,    Abdominal (+) + obese,   Peds  Hematology negative hematology ROS (+)   Anesthesia Other Findings   Reproductive/Obstetrics negative OB ROS                           Anesthesia Physical Anesthesia Plan  ASA: III  Anesthesia Plan: Spinal   Post-op Pain Management:    Induction: Intravenous  Airway Management Planned:   Additional Equipment:   Intra-op Plan:   Post-operative Plan:   Informed Consent: I have reviewed the patients History and Physical, chart, labs and discussed the procedure including the risks, benefits and alternatives for the proposed anesthesia with the patient or authorized representative who has indicated his/her understanding and acceptance.   Dental advisory given  Plan Discussed with: CRNA  Anesthesia Plan Comments:      Anesthesia Quick Evaluation

## 2012-12-22 NOTE — Op Note (Signed)
Pre-operative diagnosis- Osteoarthritis  Left knee(s)  Post-operative diagnosis- Osteoarthritis Left knee(s)  Procedure-  Left  Total Knee Arthroplasty  Surgeon- Gus Rankin. Deshante Cassell, MD  Assistant- [name], PA-C   Anesthesia-  Spinal EBL-* No blood loss amount entered *  Drains Hemovac  Tourniquet time-  Total Tourniquet Time Documented: Thigh (Left) - 33 minutes Total: Thigh (Left) - 33 minutes    Complications- None  Condition-PACU - hemodynamically stable.   Brief Clinical Note  [name] is a 72 y.o. year old female with end stage OA of her left knee with progressively worsening pain and dysfunction. She has constant pain, with activity and at rest and significant functional deficits with difficulties even with ADLs. She has had extensive non-op management including analgesics, injections of cortisone and viscosupplements, and home exercise program, but remains in significant pain with significant dysfunction. Radiographs show bone on bone arthritis all 3 compartments with tibial subluxation. She presents now for left Total Knee Arthroplasty.     Procedure in detail---   The patient is brought into the operating room and positioned supine on the operating table. After successful administration of  Spinal,   a tourniquet is placed high on the  Left thigh(s) and the lower extremity is prepped and draped in the usual sterile fashion. Time out is performed by the operating team and then the  Left lower extremity is wrapped in Esmarch, knee flexed and the tourniquet inflated to 300 mmHg.       A midline incision is made with a ten blade through the subcutaneous tissue to the level of the extensor mechanism. A fresh blade is used to make a medial parapatellar arthrotomy. Soft tissue over the proximal medial tibia is subperiosteally elevated to the joint line with a knife and into the semimembranosus bursa with a Cobb elevator. Soft tissue over the proximal lateral tibia is elevated  with attention being paid to avoiding the patellar tendon on the tibial tubercle. The patella is everted, knee flexed 90 degrees and the ACL and PCL are removed. Findings are tricompartmental bone on bone changes with large global osteophytes.        The drill is used to create a starting hole in the distal femur and the canal is thoroughly irrigated with sterile saline to remove the fatty contents. The 5 degree Left  valgus alignment guide is placed into the femoral canal and the distal femoral cutting block is pinned to remove 10 mm off the distal femur. Resection is made with an oscillating saw.      The tibia is subluxed forward and the menisci are removed. The extramedullary alignment guide is placed referencing proximally at the medial aspect of the tibial tubercle and distally along the second metatarsal axis and tibial crest. The block is pinned to remove 2mm off the more deficient medial  side. Resection is made with an oscillating saw. Size 3is the most appropriate size for the tibia and the proximal tibia is prepared with the modular drill and keel punch for that size.      The femoral sizing guide is placed and size 3 is most appropriate. Rotation is marked off the epicondylar axis and confirmed by creating a rectangular flexion gap at 90 degrees. The size 3 cutting block is pinned in this rotation and the anterior, posterior and chamfer cuts are made with the oscillating saw. The intercondylar block is then placed and that cut is made.      Trial size 3 tibial component,  trial size 3 posterior stabilized femur and a 12.5  mm posterior stabilized rotating platform insert trial is placed. Full extension is achieved with excellent varus/valgus and anterior/posterior balance throughout full range of motion. The patella is everted and thickness measured to be 22  mm. Free hand resection is taken to 12 mm, a 38 template is placed, lug holes are drilled, trial patella is placed, and it tracks normally.  Osteophytes are removed off the posterior femur with the trial in place. All trials are removed and the cut bone surfaces prepared with pulsatile lavage. Cement is mixed and once ready for implantation, the size 3 tibial implant, size  3 posterior stabilized femoral component, and the size 38 patella are cemented in place and the patella is held with the clamp. The trial insert is placed and the knee held in full extension. The Exparel (20 ml mixed with 30 ml saline) and .25% Bupivicaine, are injected into the extensor mechanism, posterior capsule, medial and lateral gutters and subcutaneous tissues.  All extruded cement is removed and once the cement is hard the permanent 12.5 mm posterior stabilized rotating platform insert is placed into the tibial tray.      The wound is copiously irrigated with saline solution and the extensor mechanism closed over a hemovac drain with #1 PDS suture. The tourniquet is released for a total tourniquet time of 33  minutes. Flexion against gravity is 140 degrees and the patella tracks normally. Subcutaneous tissue is closed with 2.0 vicryl and subcuticular with running 4.0 Monocryl. The incision is cleaned and dried and steri-strips and a bulky sterile dressing are applied. The limb is placed into a knee immobilizer and the patient is awakened and transported to recovery in stable condition.      Please note that a surgical assistant was a medical necessity for this procedure in order to perform it in a safe and expeditious manner. Surgical assistant was necessary to retract the ligaments and vital neurovascular structures to prevent injury to them and also necessary for proper positioning of the limb to allow for anatomic placement of the prosthesis.   Gus Rankin Shelia Jolley, MD    12/22/2012, 9:16 AM

## 2012-12-22 NOTE — H&P (Signed)
[name]  DOB: Oct 14, 1940  Divorced / Language: English / Race: White  Female  Date of Admission: 12/22/12 Chief Complaint: Left Knee Pain  History of Present Illness  The patient is a 72 year old female who comes in for a preoperative History and Physical. The patient is scheduled for a left total knee arthroplasty to be performed by Dr. Gus Rankin. Shelia Broxterman, MD at [facility] on 7/21//2014.  The patient is a 72 year old female who presents with knee complaints. The patient was seen for a second opinion. The patient reports left knee  symptoms including: pain, instability, giving way, weakness and stiffness which began year(s) ago without any known injury. The patient describes the severity of the symptoms as moderate in severity.The patient feels that the symptoms are worsening. The patient has the current diagnosis of knee osteoarthritis. Prior to being seen today the patient was previously evaluated by a colleague. Previous work-up for this problem has included knee x-rays. Past treatment for this problem has included intra-articular injection of corticosteroids (as well as Synvisc-One in the right knee) and nonsteroidal anti-inflammatory drugs (Celebrex, stopped due to stomach upset). Current treatment includes opioid analgesics (ultracet).  She says she has significant pain and dysfunction in the left knee. She had a recent right TKA with excellent result. She's at that stage now where she can barely even move. Dr. Otelia Sergeant has treated her in the past with injections but she wanted to come to me for consideration of surgical treatment. She says the injections were no longer beneficial. She's at a stage where she is hurting all the time. It's limiting what she can and cannot do. She used to be very active but is no longer active due to the pain. she is ready to proceed with her knee surgery.  They have been treated conservatively in the past for the above stated problem and despite  conservative measures, they continue to have progressive pain and severe functional limitations and dysfunction. They have failed non-operative management including home exercise, medications, and injections. It is felt that they would benefit from undergoing total joint replacement. Risks and benefits of the procedure have been discussed with the patient and they elect to proceed with surgery. There are no active contraindications to surgery such as ongoing infection or rapidly progressive neurological disease.   Problem List  Primary osteoarthritis of both knees (715.16)   Allergies  Penicillins. Rash, Itching.   Family History   Heart Disease. mother, father and grandfather fathers side  Heart disease in female family member before age 1  Hypertension. mother  Diabetes Mellitus. father  Cancer. father  Congestive Heart Failure. mother, father and grandfather fathers side  Father. Deceased, Congestive Heart Failure. age 13  Mother. Deceased, Congestive Heart Failure. age 57   Social History  Drug/Alcohol Rehab (Currently). no  Drug/Alcohol Rehab (Previously). no  Illicit drug use. no  Current work status. retired  Alcohol use. never consumed alcohol  Children. 2  Tobacco use. never smoker  Marital status. divorced  Pain Contract. no  Tobacco / smoke exposure. no  Post-Surgical Plans. Plan is to go home with family.  Advance Directives. Living Will, Healthcare POA   Medication History  Tramadol-Acetaminophen (37.5-325MG  Tablet, Oral) Active.  Citalopram Hydrobromide (10MG  Tablet, Oral) Active.  Warfarin Sodium (5MG  Tablet, Oral) Active.  Vitamin B1 ( Oral) Specific dose unknown - Active.  Potassium Citrate ER (10 MEQ(1080 MG) Tablet ER, Oral) Active.  Omega 3 ( Oral) Specific dose unknown -  Active.  Metoprolol Tartrate (50MG  Tablet, Oral) Active.  MetFORMIN HCl ER (500MG  Tablet ER 24HR, Oral) Active.  Losartan Potassium-HCTZ ( Oral) Specific dose unknown - Active.   Furosemide (40MG  Tablet, Oral) Active.  Diltiazem HCl ER Coated Beads (120MG  Capsule ER 24HR, Oral) Active.  Vitamin D3 ( Oral) Specific dose unknown - Active.  Oscal 500/200 D-3 (500-200MG -UNIT Tablet, Oral) Active.  Eylea (2MG /0.05ML Solution, Intraocular) Active.   Past Surgical History  Hysterectomy. complete (non-cancerous)  Medical History  Sleep Apnea. uses CPAP  High blood pressure  Kidney Stone  Osteoarthritis  Diabetes Mellitus, Type II  Anxiety Disorder  Cardiac Arrhythmia  Chronic Pain  Macular Degeneration  Atrial Fibrillation. Paroxsymal   Review of Systems  General: Not Present- Chills, Fever, Night Sweats, Fatigue, Weight Gain, Weight Loss and Memory Loss.  Skin: Not Present- Hives, Itching, Rash, Eczema and Lesions.  HEENT: Present- Tinnitus. Not Present- Headache, Double Vision, Visual Loss, Hearing Loss and Dentures.  Respiratory: Not Present- Shortness of breath with exertion, Shortness of breath at rest, Allergies, Coughing up blood and Chronic Cough.  Cardiovascular: Not Present- Chest Pain, Racing/skipping heartbeats, Difficulty Breathing Lying Down, Murmur, Swelling and Palpitations.  Gastrointestinal: Not Present- Bloody Stool, Heartburn, Abdominal Pain, Vomiting, Nausea, Constipation, Diarrhea, Difficulty Swallowing, Jaundice and Loss of appetitie.  Female Genitourinary: Not Present- Blood in Urine, Urinary frequency, Weak urinary stream, Discharge, Flank Pain, Incontinence, Painful Urination, Urgency, Urinary Retention and Urinating at Night.  Musculoskeletal: Present- Joint Swelling, Joint Pain and Morning Stiffness. Not Present- Muscle Weakness, Muscle Pain, Back Pain and Spasms.  Neurological: Present- Tremor. Not Present- Dizziness, Blackout spells, Paralysis, Difficulty with balance and Weakness.  Psychiatric: Not Present- Insomnia.   Vitals  Weight: 215 lb Height: 65 in  Weight was reported by patient.  Height was reported by patient.  Body  Surface Area: 2.11 m Body Mass Index: 35.78 kg/m  Pulse: 68 (Irregular) Resp.: 12 (Unlabored)  BP: 154/82 (Sitting, Left Arm, Standard)   Physical Exam  The physical exam findings are as follows:  Note: Patient is a 72 year old female with continued bilateral knee pain.  General  Mental Status - Alert, cooperative and good historian. General Appearance - pleasant. Not in acute distress. Orientation - Oriented X3. Build & Nutrition - Well nourished and Well developed.  Head and Neck  Head - normocephalic, atraumatic .  Neck  Global Assessment - supple. no bruit auscultated on the right and no bruit auscultated on the left.  Eye  Pupil - Bilateral - Regular and Round.  Motion - Bilateral - EOMI.  Chest and Lung Exam  Auscultation:  Breath sounds: - clear at anterior chest wall and - clear at posterior chest wall.  Adventitious sounds: - No Adventitious sounds.  Cardiovascular  Auscultation: Rhythm - Regular rate and rhythm. Heart Sounds - S1 WNL and S2 WNL.  Murmurs & Other Heart Sounds: Auscultation of the heart reveals - No Murmurs.  Abdomen  Inspection: Contour - Generalized moderate distention.  Palpation/Percussion: Tenderness - Abdomen is non-tender to palpation. Rigidity (guarding) - Abdomen is soft.  Auscultation: Auscultation of the abdomen reveals - Bowel sounds normal.  Female Genitourinary  Not done, not pertinent to present illness  Musculoskeletal  On exam, she's a well-developed female, alert and oriented, in no apparent distress. Evaluation of her hips reveals normal ROM with no discomfort. Left knee: No effusion. Range about 5-120. Moderate crepitus on ROM. No lateral tenderness. She has some medial tenderness but no instability. Right knee: Range 10-110. Marked  crepitus on ROM with varus deformity. Tenderness medially. No lateral tenderness. No instability noted. Pulses, sensation and motor are intact.   RADIOGRAPHS:  AP of both knees and lateral show that she has  advanced end stage tricompartmental arthritis of both knees, right worse with left with significant deformity present.   Assessment & Plan  Primary osteoarthritis of both knees (715.16)  Impression: Left Knee  Note: Plan is for a Left Total Knee Replacement  by Dr. Lequita Halt.  Plan is to go home with family.   PCP - Dr. Marinda Elk  Cardiology - Dr. Carolanne Grumbling

## 2012-12-22 NOTE — Interval H&P Note (Signed)
History and Physical Interval Note:  12/22/2012 7:11 AM  [name]  has presented today for surgery, with the diagnosis of OA LEFT KNEE   The various methods of treatment have been discussed with the patient and family. After consideration of risks, benefits and other options for treatment, the patient has consented to  Procedure(s): LEFT TOTAL KNEE ARTHROPLASTY (Left) as a surgical intervention .  The patient's history has been reviewed, patient examined, no change in status, stable for surgery.  I have reviewed the patient's chart and labs.  Questions were answered to the patient's satisfaction.     Loanne Drilling

## 2012-12-22 NOTE — Progress Notes (Signed)
ANTICOAGULATION CONSULT NOTE - Initial Consult  Pharmacy Consult for  Warfarin Indication:  Atrial Fibrillation;  VTE prophylaxis following L TKA  Allergies  Allergen Reactions  . Penicillins Rash    Patient Measurements: Height: 5\' 5"  (165.1 cm) Weight: 210 lb (95.255 kg) IBW/kg (Calculated) : 57  Vital Signs: Temp: 97.5 F (36.4 C) (07/21 1145) Temp src: Oral (07/21 1145) BP: 97/59 mmHg (07/21 1246) Pulse Rate: 75 (07/21 1246)  Labs:  Recent Labs  12/22/12 0705  LABPROT 13.4  INR 1.04    Estimated Creatinine Clearance: 72.6 ml/min (by C-G formula based on Cr of 0.57).   Medical History: Past Medical History  Diagnosis Date  . Arthritis   . Hypertension   . Anticoagulated on warfarin CHRONIC    HX PAF  . Osteoarthritis of knee BILATERAL  . Ankle edema LEFT  . Ureteral stone LEFT  . Macular degeneration of both eyes     RECEIVES AVASTIN INJECTION TO LEFT EYE EVERY 6-8 WKS  . Anxiety   . Atrial fibrillation CARDIOLOGIST- DR Gloris Manchester TURNER LAST VISIT 02-20-11 (WILL REQUEST NOTE, STRESS TEST AND ECHO)    UNSUCCESSFUL CARDIOVERSION IN 2010--- CHRONIC COUMADIN THERAPY  . Sleep apnea     SETTING IS 4 ON CPAP  . Diabetes mellitus     ORAL MED TO "PREVENT DIABETES"--PT DOES NOT HAVE TO CHECK HER SUGARS  . History of kidney stones     Medications:  Scheduled:  . acetaminophen  1,000 mg Oral Q6H   Or  . acetaminophen  650 mg Rectal Q6H  .  ceFAZolin (ANCEF) IV  1 g Intravenous Q6H  . [START ON 12/23/2012] dexamethasone  10 mg Oral Daily   Or  . [START ON 12/23/2012] dexamethasone  10 mg Intravenous Daily  . diltiazem  120 mg Oral q morning - 10a  . docusate sodium  100 mg Oral BID  . [START ON 12/23/2012] enoxaparin (LOVENOX) injection  30 mg Subcutaneous Q12H  . furosemide  40 mg Oral q morning - 10a  . losartan  100 mg Oral Daily   And  . hydrochlorothiazide  12.5 mg Oral Daily  . insulin aspart  0-15 Units Subcutaneous TID WC  . [START ON 12/24/2012]  metFORMIN  500 mg Oral Q breakfast  . metoprolol  25 mg Oral BID  . potassium citrate  10 mEq Oral TID WC   Infusions:  . 0.9 % NaCl with KCl 20 mEq / L 75 mL/hr at 12/22/12 1030   PRN: bisacodyl, diphenhydrAMINE, ketorolac, menthol-cetylpyridinium, methocarbamol (ROBAXIN) IV, methocarbamol, metoCLOPramide (REGLAN) injection, metoCLOPramide, morphine injection, ondansetron (ZOFRAN) IV, ondansetron, oxyCODONE, phenol, polyethylene glycol, sodium phosphate, traMADol  Assessment: 72 y/o on chronic warfarin for atrial fibrillation, now s/p L TKA 7/21.   Warfarin resumed postoperatively.  Patient's usual warfarin dosage is reportedly 5 mg daily except 7.5 mg on Fridays - last taken 7/15, then stopped for surgery.   Goal Range:  INR 2-3 Monitor platelets by anticoagulation protocol: Yes   Plan:  1. Warfarin 7.5mg  PO x 1 tonight at 1800. 2. Tomorrow AM, begin Lovenox 30 mg SQ q12h as ordered by ortho. 3. PT/INR daily while inpatient.  Elie Goody, PharmD, BCPS Pager: [phone] 12/22/2012  1:33 PM

## 2012-12-22 NOTE — Evaluation (Signed)
Physical Therapy Evaluation Patient Details Name: [name] MRN: [id number] DOB: 09-Nov-1940 Today's Date: 12/22/2012 Time: 4098-1191 PT Time Calculation (min): 33 min  PT Assessment / Plan / Recommendation History of Present Illness  s/p LTKA. on 12/22/12. Pt up same day of surgery.  Clinical Impression  Pt tolerated very well ambulating x 15' Pt plans to DC to home with daughter assisting. Pt will benefit from PT while in acute care.    PT Assessment  Patient needs continued PT services    Follow Up Recommendations  Home health PT    Does the patient have the potential to tolerate intense rehabilitation      Barriers to Discharge        Equipment Recommendations  None recommended by PT    Recommendations for Other Services     Frequency 7X/week    Precautions / Restrictions Precautions Precautions: Knee Required Braces or Orthoses: Knee Immobilizer - Left Knee Immobilizer - Left: Discontinue once straight leg raise with < 10 degree lag   Pertinent Vitals/Pain 6 L knee, received medication. Ice.      Mobility  Bed Mobility Bed Mobility: Supine to Sit Supine to Sit: 4: Min assist Details for Bed Mobility Assistance: pt able to move to sitting upright , able to move leg to edge. Transfers Transfers: Sit to Stand;Stand to Sit Sit to Stand: 1: +2 Total assist;From bed;With upper extremity assist Sit to Stand: Patient Percentage: 80% Stand to Sit: 4: Min assist;With armrests;To chair/3-in-1;With upper extremity assist Details for Transfer Assistance: cues for reaching back to recliner. Ambulation/Gait Ambulation/Gait Assistance: 1: +2 Total assist Ambulation/Gait: Patient Percentage: 80% Ambulation Distance (Feet): 15 Feet Assistive device: Rolling walker Gait Pattern: Step-to pattern;Antalgic    Exercises     PT Diagnosis: Difficulty walking;Acute pain  PT Problem List: Decreased strength;Decreased range of motion;Decreased activity tolerance;Decreased  mobility;Decreased knowledge of precautions;Decreased safety awareness;Decreased knowledge of use of DME;Pain PT Treatment Interventions: DME instruction;Gait training;Stair training;Functional mobility training;Therapeutic activities;Therapeutic exercise;Patient/family education     PT Goals(Current goals can be found in the care plan section) Acute Rehab PT Goals Patient Stated Goal: I want to walk without pain. PT Goal Formulation: With patient/family Time For Goal Achievement: 12/29/12 Potential to Achieve Goals: Good  Visit Information  Last PT Received On: 12/22/12 Assistance Needed: +1 History of Present Illness: s/p LTKA. on 12/22/12. Pt up same day of surgery.       Prior Functioning  Home Living Family/patient expects to be discharged to:: Private residence Living Arrangements: Children Available Help at Discharge: Family Type of Home: House Home Access: Stairs to enter Secretary/administrator of Steps: 4 Entrance Stairs-Rails: Right;Left Home Layout: One level Home Equipment: Environmental consultant - 2 wheels;Walker - 4 wheels;Bedside commode (lift chair.) Prior Function Level of Independence: Independent with assistive device(s) Communication Communication: No difficulties    Cognition  Cognition Arousal/Alertness: Awake/alert Behavior During Therapy: WFL for tasks assessed/performed Overall Cognitive Status: Within Functional Limits for tasks assessed    Extremity/Trunk Assessment Upper Extremity Assessment Upper Extremity Assessment: Overall WFL for tasks assessed Lower Extremity Assessment Lower Extremity Assessment: LLE deficits/detail LLE Deficits / Details: able to perform SLR.   Balance    End of Session PT - End of Session Equipment Utilized During Treatment: Left knee immobilizer Activity Tolerance: Patient tolerated treatment well Patient left: in chair;with call bell/phone within reach;with family/visitor present Nurse Communication: Mobility status  GP      Rada Hay 12/22/2012, 5:08 PM

## 2012-12-22 NOTE — Anesthesia Procedure Notes (Signed)
Spinal  Patient location during procedure: OR Start time: 12/22/2012 8:23 AM End time: 12/22/2012 8:27 AM Staffing Anesthesiologist: Lewie Loron R Performed by: anesthesiologist  Preanesthetic Checklist Completed: patient identified, site marked, surgical consent, pre-op evaluation, timeout performed, IV checked, risks and benefits discussed and monitors and equipment checked Spinal Block Patient position: sitting Prep: ChloraPrep Patient monitoring: heart rate, continuous pulse ox and blood pressure Approach: midline Location: L3-4 Injection technique: single-shot Needle Needle type: Quincke  Needle gauge: 22 G Needle length: 9 cm Assessment Sensory level: T8 Additional Notes Expiration date of kit checked and confirmed. Patient tolerated procedure well, without complications.

## 2012-12-22 NOTE — Progress Notes (Signed)
Utilization review completed.  

## 2012-12-23 ENCOUNTER — Encounter (HOSPITAL_COMMUNITY): Payer: Self-pay | Admitting: Orthopedic Surgery

## 2012-12-23 DIAGNOSIS — I10 Essential (primary) hypertension: Secondary | ICD-10-CM

## 2012-12-23 DIAGNOSIS — D62 Acute posthemorrhagic anemia: Secondary | ICD-10-CM

## 2012-12-23 DIAGNOSIS — E871 Hypo-osmolality and hyponatremia: Secondary | ICD-10-CM

## 2012-12-23 DIAGNOSIS — E119 Type 2 diabetes mellitus without complications: Secondary | ICD-10-CM

## 2012-12-23 DIAGNOSIS — G4730 Sleep apnea, unspecified: Secondary | ICD-10-CM

## 2012-12-23 LAB — PROTIME-INR
INR: 1.06 (ref 0.00–1.49)
Prothrombin Time: 13.6 seconds (ref 11.6–15.2)

## 2012-12-23 LAB — BASIC METABOLIC PANEL
Calcium: 8.6 mg/dL (ref 8.4–10.5)
GFR calc non Af Amer: >90 mL/min (ref >90)
Glucose, Bld: 161 mg/dL — ABNORMAL HIGH (ref 70–99)
Potassium: 4.1 mEq/L (ref 3.5–5.1)
Sodium: 133 mEq/L — ABNORMAL LOW (ref 135–145)

## 2012-12-23 LAB — GLUCOSE, CAPILLARY

## 2012-12-23 LAB — CBC
Hemoglobin: 11.4 g/dL — ABNORMAL LOW (ref 12.0–15.0)
MCH: 29.9 pg (ref 26.0–34.0)
MCHC: 34.1 g/dL (ref 30.0–36.0)
Platelets: 240 10*3/uL (ref 150–400)
RBC: 3.81 MIL/uL — ABNORMAL LOW (ref 3.87–5.11)

## 2012-12-23 MED ORDER — WARFARIN SODIUM 7.5 MG PO TABS
7.5000 mg | ORAL_TABLET | Freq: Once | ORAL | Status: AC
  Administered 2012-12-23: 7.5 mg via ORAL
  Filled 2012-12-23: qty 1

## 2012-12-23 NOTE — Progress Notes (Signed)
Physical Therapy Treatment Patient Details Name: [name] MRN: [id number] DOB: Jul 11, 1940 Today's Date: 12/23/2012 Time: 8657-8469 PT Time Calculation (min): 32 min  PT Assessment / Plan / Recommendation  History of Present Illness     Clinical Impression    PT Comments   Pt increased distance but limited by pain after meds. Planns DC tomorrow if able.   Follow Up Recommendations  Home health PT     Does the patient have the potential to tolerate intense rehabilitation     Barriers to Discharge        Equipment Recommendations  None recommended by PT    Recommendations for Other Services    Frequency 7X/week   Progress towards PT Goals Progress towards PT goals: Progressing toward goals  Plan Current plan remains appropriate    Precautions / Restrictions Precautions Precautions: Knee Required Braces or Orthoses: Knee Immobilizer - Left   Pertinent Vitals/Pain 1 at rest, 4 with walking., ice applied.    Mobility  Bed Mobility Supine to Sit: 4: Min assist Details for Bed Mobility Assistance: pt able to move to sitting upright , able to move leg to edge., extra time. Transfers Sit to Stand: 4: Min assist;From toilet Stand to Sit: With upper extremity assist;To chair/3-in-1;4: Min assist Details for Transfer Assistance: cues for reaching back, LLE position Ambulation/Gait Ambulation/Gait Assistance: 4: Min assist Ambulation Distance (Feet): 25 Feet Assistive device: Rolling walker Ambulation/Gait Assistance Details: extra time , pt fatigued, increased pain.    Exercises Total Joint Exercises Quad Sets: AROM;Both;10 reps Short Arc Quad: AROM;10 reps;Left Heel Slides: AROM;Both;10 reps Hip ABduction/ADduction: AROM;Both;10 reps Straight Leg Raises: AAROM;Both;10 reps Goniometric ROM: 10-45 L knee.   PT Diagnosis:    PT Problem List:   PT Treatment Interventions:     PT Goals (current goals can now be found in the care plan section)    Visit  Information  Last PT Received On: 12/23/12 Assistance Needed: +1    Subjective Data      Cognition  Cognition Arousal/Alertness: Awake/alert    Balance     End of Session PT - End of Session Equipment Utilized During Treatment: Left knee immobilizer Activity Tolerance: Patient limited by fatigue;Patient limited by pain Patient left: in chair;with call bell/phone within reach;with family/visitor present Nurse Communication: Mobility status   GP     Rada Hay 12/23/2012, 12:35 PM Blanchard Kelch PT [phone]

## 2012-12-23 NOTE — Progress Notes (Signed)
   Subjective: 1 Day Post-Op Procedure(s) (LRB): LEFT TOTAL KNEE ARTHROPLASTY (Left) Patient reports pain as mild.   Patient seen in rounds with Dr. Lequita Halt. She is doing well this morning. Patient is well, and has had no acute complaints or problems We will start therapy today.  Plan is to go Home after hospital stay.  Objective: Vital signs in last 24 hours: Temp:  [97.5 F (36.4 C)-99.3 F (37.4 C)] 98.7 F (37.1 C) (07/22 0556) Pulse Rate:  [57-95] 95 (07/22 0556) Resp:  [13-20] 16 (07/22 0556) BP: (90-136)/(54-88) 126/85 mmHg (07/22 0556) SpO2:  [93 %-100 %] 93 % (07/22 0556) FiO2 (%):  [2 %] 2 % (07/21 1145) Weight:  [95.255 kg (210 lb)] 95.255 kg (210 lb) (07/21 1145)  Intake/Output from previous day:  Intake/Output Summary (Last 24 hours) at 12/23/12 0846 Last data filed at 12/23/12 0800  Gross per 24 hour  Intake 4466.25 ml  Output   1945 ml  Net 2521.25 ml    Intake/Output this shift: UOP 400 since MN +2521  Labs:  Recent Labs  12/23/12 0402  HGB 11.4*    Recent Labs  12/23/12 0402  WBC 9.3  RBC 3.81*  HCT 33.4*  PLT 240    Recent Labs  12/23/12 0402  NA 133*  K 4.1  CL 99  CO2 28  BUN 10  CREATININE 0.55  GLUCOSE 161*  CALCIUM 8.6    Recent Labs  12/22/12 0705 12/23/12 0402  INR 1.04 1.06    EXAM General - Patient is Alert, Appropriate and Oriented Extremity - Neurovascular intact Sensation intact distally Dorsiflexion/Plantar flexion intact Dressing - dressing C/D/I Motor Function - intact, moving foot and toes well on exam.  Hemovac pulled without difficulty.  Past Medical History  Diagnosis Date  . Arthritis   . Hypertension   . Anticoagulated on warfarin CHRONIC    HX PAF  . Osteoarthritis of knee BILATERAL  . Ankle edema LEFT  . Ureteral stone LEFT  . Macular degeneration of both eyes     RECEIVES AVASTIN INJECTION TO LEFT EYE EVERY 6-8 WKS  . Anxiety   . Atrial fibrillation CARDIOLOGIST- DR Gloris [place] TURNER  LAST VISIT 02-20-11 (WILL REQUEST NOTE, STRESS TEST AND ECHO)    UNSUCCESSFUL CARDIOVERSION IN 2010--- CHRONIC COUMADIN THERAPY  . Sleep apnea     SETTING IS 4 ON CPAP  . Diabetes mellitus     ORAL MED TO "PREVENT DIABETES"--PT DOES NOT HAVE TO CHECK HER SUGARS  . History of kidney stones     Assessment/Plan: 1 Day Post-Op Procedure(s) (LRB): LEFT TOTAL KNEE ARTHROPLASTY (Left) Active Problems:   Postoperative anemia due to acute blood loss   Hyponatremia - recheck labs   Unspecified essential hypertension - resumed home meds.  Low pressure postop. Add parameters to meds as needed.   Unspecified sleep apnea - ordered CPAP   Diabetes - resumed home meds, modified carb diet  Estimated body mass index is 34.95 kg/(m^2) as calculated from the following:   Height as of this encounter: 5\' 5"  (1.651 m).   Weight as of this encounter: 95.255 kg (210 lb). Advance diet Up with therapy Plan for discharge tomorrow Discharge home with home health  DVT Prophylaxis - Lovenox and Coumadin Weight-Bearing as tolerated to left leg No vaccines. D/C O2 and Pulse OX and try on Room [address]  Patrica Duel 12/23/2012, 8:46 AM

## 2012-12-23 NOTE — Progress Notes (Signed)
Spoke with pt about readiness for nighttime CPAP, pt prefers to self administer when ready. Machine at bedside plugged into appropriate outlet/pt has home nasal mask. Tolerated well previous night. RT will assist as needed.

## 2012-12-23 NOTE — Progress Notes (Signed)
OT Cancellation Note  Patient Details Name: JAZZMON PRINDLE MRN: [id number] DOB: [birth year]   Cancelled Treatment:    Reason Eval/Treat Not Completed: PT screened, no needs identified, will sign off  Jenavie Stanczak 12/23/2012, 7:31 AM Marica Otter, OTR/L [phone] 12/23/2012

## 2012-12-23 NOTE — Progress Notes (Signed)
Physical Therapy Treatment Patient Deta Name: [name] MRN: [id number] DOB: 19-Jun-1940 Today's Date: 12/23/2012 Time: 7829-5621 PT Time Calculation (min): 20 min  PT Assessment / Plan / Recommendation  History of Present Illness     Clinical Impression    PT Comments    IMPROVED IN MOBILITY THIS SESSION AND AMBULATED X 50 '.   Follow Up Recommendations  Home health PT     Does the patient have the potential to tolerate intense rehabilitation     Barriers to Discharge        Equipment Recommendations  None recommended by PT    Recommendations for Other Services    Frequency 7X/week   Progress towards PT Goals Progress towards PT goals: Progressing toward goals  Plan Current plan remains appropriate    Precautions / Restrictions Precautions Precautions: Knee Required Braces or Orthoses: Knee Immobilizer - Left   Pertinent Vitals/Pain Pain is <4 ,     Mobility  Bed Mobility Bed Mobility: Sit to Supine  Sit to Supine: 4: Min assist Details for Bed Mobility Assistance: assistance for LLE onto bed. Transfers Sit to Stand: 4: Min assist;From chair/3-in-1;With upper extremity assist Stand to Sit: To bed;With upper extremity assist;4: Min guard Details for Transfer Assistance: pt placed LLE forward and reached back to bed surface. Ambulation/Gait Ambulation/Gait Assistance: 4: Min assist Ambulation Distance (Feet): 50 Feet Assistive device: Rolling walker Ambulation/Gait Assistance Details: pt moving much better, less discomfort. Gait Pattern: Step-to pattern;Antalgic    Exercises    PT Diagnosis:    PT Problem List:   PT Treatment Interventions:     PT Goals (current goals can now be found in the care plan section)    Visit Information  Last PT Received On: 12/23/12 Assistance Needed: +1    Subjective Data      Cognition  Cognition Arousal/Alertness: Awake/alert    Balance     End of Session PT - End of Session Equipment Utilized During  Treatment: Left knee immobilizer Activity Tolerance: Patient tolerated treatment well Patient left: in bed;with call bell/phone within reach Nurse Communication: Mobility status   GP     Rada Hay 12/23/2012, 2:56 PM

## 2012-12-23 NOTE — Progress Notes (Signed)
ANTICOAGULATION CONSULT NOTE - follow-up Consult  Pharmacy Consult for  Warfarin Indication:  Atrial Fibrillation;  VTE prophylaxis following L TKA  Allergies  Allergen Reactions  . Penicillins Rash    Patient Measurements: Height: 5\' 5"  (165.1 cm) Weight: 210 lb (95.255 kg) IBW/kg (Calculated) : 57  Vital Signs: Temp: 98.2 F (36.8 C) (07/22 1019) Temp src: Oral (07/22 1019) BP: 121/78 mmHg (07/22 1019) Pulse Rate: 78 (07/22 1019)  Labs:  Recent Labs  12/22/12 0705 12/23/12 0402  HGB  --  11.4*  HCT  --  33.4*  PLT  --  240  LABPROT 13.4 13.6  INR 1.04 1.06  CREATININE  --  0.55    Estimated Creatinine Clearance: 72.6 ml/min (by C-G formula based on Cr of 0.55).   Medical History: Past Medical History  Diagnosis Date  . Arthritis   . Hypertension   . Anticoagulated on warfarin CHRONIC    HX PAF  . Osteoarthritis of knee BILATERAL  . Ankle edema LEFT  . Ureteral stone LEFT  . Macular degeneration of both eyes     RECEIVES AVASTIN INJECTION TO LEFT EYE EVERY 6-8 WKS  . Anxiety   . Atrial fibrillation CARDIOLOGIST- DR Gloris [place] TURNER LAST VISIT 02-20-11 (WILL REQUEST NOTE, STRESS TEST AND ECHO)    UNSUCCESSFUL CARDIOVERSION IN 2010--- CHRONIC COUMADIN THERAPY  . Sleep apnea     SETTING IS 4 ON CPAP  . Diabetes mellitus     ORAL MED TO "PREVENT DIABETES"--PT DOES NOT HAVE TO CHECK HER SUGARS  . History of kidney stones     Medications:  Scheduled:  . diltiazem  120 mg Oral q morning - 10a  . docusate sodium  100 mg Oral BID  . enoxaparin (LOVENOX) injection  30 mg Subcutaneous Q12H  . furosemide  40 mg Oral q morning - 10a  . losartan  100 mg Oral Daily   And  . hydrochlorothiazide  12.5 mg Oral Daily  . insulin aspart  0-15 Units Subcutaneous TID WC  . [START ON 12/24/2012] metFORMIN  500 mg Oral Q breakfast  . metoprolol  25 mg Oral BID  . potassium citrate  10 mEq Oral TID WC  . Warfarin - Pharmacist Dosing Inpatient   Does not apply q1800    Infusions:  . 0.9 % NaCl with KCl 20 mEq / L 20 mL/hr at 12/23/12 0800   PRN: bisacodyl, diphenhydrAMINE, menthol-cetylpyridinium, methocarbamol (ROBAXIN) IV, methocarbamol, metoCLOPramide (REGLAN) injection, metoCLOPramide, morphine injection, ondansetron (ZOFRAN) IV, ondansetron, oxyCODONE, phenol, polyethylene glycol, traMADol  Assessment: 72 y/o on chronic warfarin for atrial fibrillation, now s/p L TKA 7/21.   Warfarin resumed postoperatively.  Patient's usual warfarin dosage is reportedly 5 mg daily except 7.5 mg on Fridays - last taken 7/15, then stopped for surgery.  INR this am = 1.06  Patient also on enoxaparin 30mg  SQ q12h until INR >=1.8  CBC stable  Goal Range:  INR 2-3 Monitor platelets by anticoagulation protocol: Yes   Plan:  1. Warfarin 7.5mg  PO x 1 tonight at 1800. 2. Continue Lovenox 30 mg SQ q12h until INR >=1.8 3. PT/INR daily while inpatient.  Juliette Alcide, PharmD, BCPS.   Pager: [phone] 12/23/2012  11:36 AM

## 2012-12-24 LAB — BASIC METABOLIC PANEL
BUN: 13 mg/dL (ref 6–23)
CO2: 30 mEq/L (ref 19–32)
Calcium: 9.2 mg/dL (ref 8.4–10.5)
Chloride: 98 mEq/L (ref 96–112)
Creatinine, Ser: 0.51 mg/dL (ref 0.50–1.10)
Glucose, Bld: 196 mg/dL — ABNORMAL HIGH (ref 70–99)

## 2012-12-24 LAB — CBC
HCT: 32.3 % — ABNORMAL LOW (ref 36.0–46.0)
Hemoglobin: 11.4 g/dL — ABNORMAL LOW (ref 12.0–15.0)
MCH: 30.6 pg (ref 26.0–34.0)
MCV: 86.8 fL (ref 78.0–100.0)
RBC: 3.72 MIL/uL — ABNORMAL LOW (ref 3.87–5.11)
WBC: 12.7 10*3/uL — ABNORMAL HIGH (ref 4.0–10.5)

## 2012-12-24 LAB — PROTIME-INR: Prothrombin Time: 15.7 seconds — ABNORMAL HIGH (ref 11.6–15.2)

## 2012-12-24 LAB — GLUCOSE, CAPILLARY
Glucose-Capillary: 173 mg/dL — ABNORMAL HIGH (ref 70–99)
Glucose-Capillary: 173 mg/dL — ABNORMAL HIGH (ref 70–99)

## 2012-12-24 MED ORDER — TRAMADOL HCL 50 MG PO TABS: 50.0000 mg | ORAL_TABLET | Freq: Four times a day (QID) | ORAL | Status: AC | PRN

## 2012-12-24 MED ORDER — OXYCODONE HCL 5 MG PO TABS: 5.0000 mg | ORAL_TABLET | ORAL | Status: DC | PRN

## 2012-12-24 MED ORDER — METHOCARBAMOL 500 MG PO TABS: 500.0000 mg | ORAL_TABLET | Freq: Four times a day (QID) | ORAL | Status: DC | PRN

## 2012-12-24 MED ORDER — WARFARIN SODIUM 5 MG PO TABS: 5.0000 mg | ORAL_TABLET | Freq: Once | ORAL | Status: DC

## 2012-12-24 MED ORDER — ENOXAPARIN SODIUM 30 MG/0.3ML ~~LOC~~ SOLN: 30.0000 mg | Freq: Two times a day (BID) | SUBCUTANEOUS | Status: DC

## 2012-12-24 MED ORDER — WARFARIN SODIUM 5 MG PO TABS
5.0000 mg | ORAL_TABLET | Freq: Once | ORAL | Status: DC
  Filled 2012-12-24: qty 1

## 2012-12-24 NOTE — Progress Notes (Signed)
ANTICOAGULATION CONSULT NOTE - follow-up Consult  Pharmacy Consult for  Warfarin Indication:  Atrial Fibrillation;  VTE prophylaxis following L TKA  Allergies  Allergen Reactions  . Penicillins Rash    Patient Measurements: Height: 5\' 5"  (165.1 cm) Weight: 210 lb (95.255 kg) IBW/kg (Calculated) : 57  Vital Signs: Temp: 98.2 F (36.8 C) (07/23 0547) BP: 125/80 mmHg (07/23 0547) Pulse Rate: 88 (07/23 0547)  Labs:  Recent Labs  12/22/12 0705 12/23/12 0402 12/24/12 0417  HGB  --  11.4* 11.4*  HCT  --  33.4* 32.3*  PLT  --  240 207  LABPROT 13.4 13.6 15.7*  INR 1.04 1.06 1.28  CREATININE  --  0.55 0.51    Estimated Creatinine Clearance: 72.6 ml/min (by C-G formula based on Cr of 0.51).   Medical History: Past Medical History  Diagnosis Date  . Arthritis   . Hypertension   . Anticoagulated on warfarin CHRONIC    HX PAF  . Osteoarthritis of knee BILATERAL  . Ankle edema LEFT  . Ureteral stone LEFT  . Macular degeneration of both eyes     RECEIVES AVASTIN INJECTION TO LEFT EYE EVERY 6-8 WKS  . Anxiety   . Atrial fibrillation CARDIOLOGIST- DR Gloris Manchester TURNER LAST VISIT 02-20-11 (WILL REQUEST NOTE, STRESS TEST AND ECHO)    UNSUCCESSFUL CARDIOVERSION IN 2010--- CHRONIC COUMADIN THERAPY  . Sleep apnea     SETTING IS 4 ON CPAP  . Diabetes mellitus     ORAL MED TO "PREVENT DIABETES"--PT DOES NOT HAVE TO CHECK HER SUGARS  . History of kidney stones     Medications:  Scheduled:  . diltiazem  120 mg Oral q morning - 10a  . docusate sodium  100 mg Oral BID  . enoxaparin (LOVENOX) injection  30 mg Subcutaneous Q12H  . furosemide  40 mg Oral q morning - 10a  . losartan  100 mg Oral Daily   And  . hydrochlorothiazide  12.5 mg Oral Daily  . insulin aspart  0-15 Units Subcutaneous TID WC  . metFORMIN  500 mg Oral Q breakfast  . metoprolol  25 mg Oral BID  . potassium citrate  10 mEq Oral TID WC  . Warfarin - Pharmacist Dosing Inpatient   Does not apply q1800    Infusions:  . 0.9 % NaCl with KCl 20 mEq / L 20 mL/hr at 12/23/12 0800   PRN: bisacodyl, diphenhydrAMINE, menthol-cetylpyridinium, methocarbamol (ROBAXIN) IV, methocarbamol, metoCLOPramide (REGLAN) injection, metoCLOPramide, morphine injection, ondansetron (ZOFRAN) IV, ondansetron, oxyCODONE, phenol, polyethylene glycol, traMADol  Assessment: 72 y/o on chronic warfarin for atrial fibrillation, now s/p L TKA 7/21.   Warfarin resumed postoperatively.  Patient's usual warfarin dosage is reportedly 5 mg daily except 7.5 mg on Fridays - last taken 7/15, then stopped for surgery.  INR this am = 1.28 (trending up nicely)  Patient also on enoxaparin 30mg  SQ q12h until INR >=1.8  CBC: Hgb and platelets stable  Goal Range:  INR 2-3 Monitor platelets by anticoagulation protocol: Yes   Plan:  1. Warfarin 5mg  PO x 1 tonight at 1800 - resume prior to admission dosing 2. Continue Lovenox 30 mg SQ q12h until INR >=1.8 3. PT/INR daily while inpatient.  Juliette Alcide, PharmD, BCPS.   Pager: [phone] 12/24/2012  9:34 AM

## 2012-12-24 NOTE — Progress Notes (Signed)
Physical Therapy Treatment Patient Details Name: [name] MRN: [id number] DOB: Dec 31, 1940 Today's Date: 12/24/2012 Time: 7829-5621 PT Time Calculation (min): 39 min  PT Assessment / Plan / Recommendation  History of Present Illness     Clinical Impression   PT Comments   Pt has progressed very well. Ready for discharge.  Follow Up Recommendations  Home health PT     Does the patient have the potential to tolerate intense rehabilitation     Barriers to Discharge        Equipment Recommendations  None recommended by PT    Recommendations for Other Services    Frequency 7X/week   Progress towards PT Goals Progress towards PT goals: Progressing toward goals  Plan Current plan remains appropriate    Precautions / Restrictions Precautions Precautions: Knee Restrictions Weight Bearing Restrictions: No   Pertinent Vitals/Pain Soreness     Mobility  Bed Mobility Bed Mobility: Supine to Sit Supine to Sit: 5: Supervision Transfers Sit to Stand: 5: Supervision;From chair/3-in-1;From bed;With upper extremity assist;With armrests Stand to Sit: With upper extremity assist;To chair/3-in-1;5: Supervision Details for Transfer Assistance: pt placed LLE forward and reached back to surface. Ambulation/Gait Ambulation/Gait Assistance: 4: Min guard Ambulation Distance (Feet): 80 Feet Gait Pattern: Step-through pattern Stairs: Yes Stairs Assistance: 4: Min assist Stairs Assistance Details (indicate cue type and reason): cues for sequence. Stair Management Technique: One rail Left;Step to pattern;Forwards;With crutches Number of Stairs: 2    Exercises Total Joint Exercises Quad Sets: AROM;Both;10 reps Short Arc Quad: AROM;10 reps;Left Heel Slides: AROM;10 reps;Left Hip ABduction/ADduction: AROM;Both;10 reps Straight Leg Raises: Both;10 reps;AROM   PT Diagnosis:    PT Problem List:   PT Treatment Interventions:     PT Goals (current goals can now be found in the care plan  section)    Visit Information  Last PT Received On: 12/24/12 Assistance Needed: +1    Subjective Data      Cognition  Cognition Arousal/Alertness: Awake/alert    Balance     End of Session PT - End of Session Activity Tolerance: Patient tolerated treatment well Patient left: in chair;with call bell/phone within reach Nurse Communication: Mobility status   GP     Rada Hay 12/24/2012, 12:36 PM

## 2012-12-24 NOTE — Progress Notes (Signed)
   Subjective: 2 Days Post-Op Procedure(s) (LRB): LEFT TOTAL KNEE ARTHROPLASTY (Left) Patient reports pain as mild.   Patient seen in rounds with Dr. Lequita Halt. Patient is well, and has had no acute complaints or problems Patient is ready to go home  Objective: Vital signs in last 24 hours: Temp:  [97.7 F (36.5 C)-98.2 F (36.8 C)] 98.2 F (36.8 C) (07/23 0547) Pulse Rate:  [80-88] 88 (07/23 0547) Resp:  [15-16] 15 (07/23 0800) BP: (106-125)/(63-80) 125/80 mmHg (07/23 0547) SpO2:  [92 %-95 %] 93 % (07/23 0547)  Intake/Output from previous day:  Intake/Output Summary (Last 24 hours) at 12/24/12 1151 Last data filed at 12/24/12 8295  Gross per 24 hour  Intake   1020 ml  Output   2350 ml  Net  -1330 ml    Intake/Output this shift: Total I/O In: 240 [P.O.:240] Out: -   Labs:  Recent Labs  12/23/12 0402 12/24/12 0417  HGB 11.4* 11.4*    Recent Labs  12/23/12 0402 12/24/12 0417  WBC 9.3 12.7*  RBC 3.81* 3.72*  HCT 33.4* 32.3*  PLT 240 207    Recent Labs  12/23/12 0402 12/24/12 0417  NA 133* 134*  K 4.1 3.8  CL 99 98  CO2 28 30  BUN 10 13  CREATININE 0.55 0.51  GLUCOSE 161* 196*  CALCIUM 8.6 9.2    Recent Labs  12/23/12 0402 12/24/12 0417  INR 1.06 1.28    EXAM: General - Patient is Alert and Appropriate Extremity - Neurovascular intact Sensation intact distally Dorsiflexion/Plantar flexion intact Incision - clean, dry Motor Function - intact, moving foot and toes well on exam.   Assessment/Plan: 2 Days Post-Op Procedure(s) (LRB): LEFT TOTAL KNEE ARTHROPLASTY (Left) Procedure(s) (LRB): LEFT TOTAL KNEE ARTHROPLASTY (Left) Past Medical History  Diagnosis Date  . Arthritis   . Hypertension   . Anticoagulated on warfarin CHRONIC    HX PAF  . Osteoarthritis of knee BILATERAL  . Ankle edema LEFT  . Ureteral stone LEFT  . Macular degeneration of both eyes     RECEIVES AVASTIN INJECTION TO LEFT EYE EVERY 6-8 WKS  . Anxiety   . Atrial  fibrillation CARDIOLOGIST- DR Gloris [place] TURNER LAST VISIT 02-20-11 (WILL REQUEST NOTE, STRESS TEST AND ECHO)    UNSUCCESSFUL CARDIOVERSION IN 2010--- CHRONIC COUMADIN THERAPY  . Sleep apnea     SETTING IS 4 ON CPAP  . Diabetes mellitus     ORAL MED TO "PREVENT DIABETES"--PT DOES NOT HAVE TO CHECK HER SUGARS  . History of kidney stones    Active Problems:   Postoperative anemia due to acute blood loss   Hyponatremia   Unspecified essential hypertension   Unspecified sleep apnea   Diabetes  Estimated body mass index is 34.95 kg/(m^2) as calculated from the following:   Height as of this encounter: 5\' 5"  (1.651 m).   Weight as of this encounter: 95.255 kg (210 lb). Discharge home with home health Diet - Cardiac diet and Diabetic diet Follow up - in 2 weeks Activity - WBAT Disposition - Home Condition Upon Discharge - Good D/C Meds - See DC Summary DVT Prophylaxis - Lovenox and Coumadin  PERKINS, ALEXZANDREW 12/24/2012, 11:51 AM

## 2012-12-24 NOTE — Discharge Summary (Signed)
Physician Discharge Summary   Patient ID: [name] MRN: [id number] DOB/AGE: February 15, 1941 72 y.o.  Admit date: 12/22/2012 Discharge date: 12/24/2012  Primary Diagnosis:  Osteoarthritis Left knee  Admission Diagnoses:  Past Medical History  Diagnosis Date  . Arthritis   . Hypertension   . Anticoagulated on warfarin CHRONIC    HX PAF  . Osteoarthritis of knee BILATERAL  . Ankle edema LEFT  . Ureteral stone LEFT  . Macular degeneration of both eyes     RECEIVES AVASTIN INJECTION TO LEFT EYE EVERY 6-8 WKS  . Anxiety   . Atrial fibrillation CARDIOLOGIST- DR Gloris Manchester TURNER LAST VISIT 02-20-11 (WILL REQUEST NOTE, STRESS TEST AND ECHO)    UNSUCCESSFUL CARDIOVERSION IN 2010--- CHRONIC COUMADIN THERAPY  . Sleep apnea     SETTING IS 4 ON CPAP  . Diabetes mellitus     ORAL MED TO "PREVENT DIABETES"--PT DOES NOT HAVE TO CHECK HER SUGARS  . History of kidney stones    Discharge Diagnoses:   Active Problems:   Postoperative anemia due to acute blood loss   Hyponatremia   Unspecified essential hypertension   Unspecified sleep apnea   Diabetes  Estimated body mass index is 34.95 kg/(m^2) as calculated from the following:   Height as of this encounter: 5\' 5"  (1.651 m).   Weight as of this encounter: 95.255 kg (210 lb).  Procedure:  Procedure(s) (LRB): LEFT TOTAL KNEE ARTHROPLASTY (Left)   Consults: None  HPI: [name] is a 72 y.o. year old female with end stage OA of her left knee with progressively worsening pain and dysfunction. She has constant pain, with activity and at rest and significant functional deficits with difficulties even with ADLs. She has had extensive non-op management including analgesics, injections of cortisone and viscosupplements, and home exercise program, but remains in significant pain with significant dysfunction. Radiographs show bone on bone arthritis all 3 compartments with tibial subluxation. She presents now for left Total Knee Arthroplasty.    Laboratory Data: Admission on 12/22/2012  Component Date Value Range Status  . Prothrombin Time 12/22/2012 13.4  11.6 - 15.2 seconds Final  . INR 12/22/2012 1.04  0.00 - 1.49 Final  . Glucose-Capillary 12/22/2012 114* 70 - 99 mg/dL Final  . Glucose-Capillary 12/22/2012 112* 70 - 99 mg/dL Final  . Glucose-Capillary 12/22/2012 192* 70 - 99 mg/dL Final  . WBC 40/98/1191 9.3  4.0 - 10.5 K/uL Final  . RBC 12/23/2012 3.81* 3.87 - 5.11 MIL/uL Final  . Hemoglobin 12/23/2012 11.4* 12.0 - 15.0 g/dL Final  . HCT 47/82/9562 33.4* 36.0 - 46.0 % Final  . MCV 12/23/2012 87.7  78.0 - 100.0 fL Final  . MCH 12/23/2012 29.9  26.0 - 34.0 pg Final  . MCHC 12/23/2012 34.1  30.0 - 36.0 g/dL Final  . RDW 13/01/6577 13.0  11.5 - 15.5 % Final  . Platelets 12/23/2012 240  150 - 400 K/uL Final  . Sodium 12/23/2012 133* 135 - 145 mEq/L Final  . Potassium 12/23/2012 4.1  3.5 - 5.1 mEq/L Final  . Chloride 12/23/2012 99  96 - 112 mEq/L Final  . CO2 12/23/2012 28  19 - 32 mEq/L Final  . Glucose, Bld 12/23/2012 161* 70 - 99 mg/dL Final  . BUN 46/96/2952 10  6 - 23 mg/dL Final  . Creatinine, Ser 12/23/2012 0.55  0.50 - 1.10 mg/dL Final  . Calcium 84/13/2440 8.6  8.4 - 10.5 mg/dL Final  . GFR calc non Af Amer 12/23/2012 >90  >90 mL/min  Final  . GFR calc Af Amer 12/23/2012 >90  >90 mL/min Final   Comment:                                 The eGFR has been calculated                          using the CKD EPI equation.                          This calculation has not been                          validated in all clinical                          situations.                          eGFR's persistently                          <90 mL/min signify                          possible Chronic Kidney Disease.  Marland Kitchen Prothrombin Time 12/23/2012 13.6  11.6 - 15.2 seconds Final  . INR 12/23/2012 1.06  0.00 - 1.49 Final  . Glucose-Capillary 12/22/2012 134* 70 - 99 mg/dL Final  . Glucose-Capillary 12/23/2012 159* 70 - 99 mg/dL  Final  . Glucose-Capillary 12/23/2012 145* 70 - 99 mg/dL Final  . Comment 1 45/40/9811 Documented in Chart   Final  . Comment 2 12/23/2012 Notify RN   Final  . Glucose-Capillary 12/23/2012 201* 70 - 99 mg/dL Final  . WBC 91/47/8295 12.7* 4.0 - 10.5 K/uL Final  . RBC 12/24/2012 3.72* 3.87 - 5.11 MIL/uL Final  . Hemoglobin 12/24/2012 11.4* 12.0 - 15.0 g/dL Final  . HCT 62/13/0865 32.3* 36.0 - 46.0 % Final  . MCV 12/24/2012 86.8  78.0 - 100.0 fL Final  . MCH 12/24/2012 30.6  26.0 - 34.0 pg Final  . MCHC 12/24/2012 35.3  30.0 - 36.0 g/dL Final  . RDW 78/46/9629 13.0  11.5 - 15.5 % Final  . Platelets 12/24/2012 207  150 - 400 K/uL Final  . Sodium 12/24/2012 134* 135 - 145 mEq/L Final  . Potassium 12/24/2012 3.8  3.5 - 5.1 mEq/L Final  . Chloride 12/24/2012 98  96 - 112 mEq/L Final  . CO2 12/24/2012 30  19 - 32 mEq/L Final  . Glucose, Bld 12/24/2012 196* 70 - 99 mg/dL Final  . BUN 52/84/1324 13  6 - 23 mg/dL Final  . Creatinine, Ser 12/24/2012 0.51  0.50 - 1.10 mg/dL Final  . Calcium 40/03/2724 9.2  8.4 - 10.5 mg/dL Final  . GFR calc non Af Amer 12/24/2012 >90  >90 mL/min Final  . GFR calc Af Amer 12/24/2012 >90  >90 mL/min Final   Comment:                                 The eGFR has been calculated  using the CKD EPI equation.                          This calculation has not been                          validated in all clinical                          situations.                          eGFR's persistently                          <90 mL/min signify                          possible Chronic Kidney Disease.  Marland Kitchen Prothrombin Time 12/24/2012 15.7* 11.6 - 15.2 seconds Final  . INR 12/24/2012 1.28  0.00 - 1.49 Final  . Glucose-Capillary 12/23/2012 205* 70 - 99 mg/dL Final  . Comment 1 56/21/3086 Notify RN   Final  . Glucose-Capillary 12/24/2012 173* 70 - 99 mg/dL Final  . Glucose-Capillary 12/24/2012 173* 70 - 99 mg/dL Final  Hospital Outpatient Visit on  12/16/2012  Component Date Value Range Status  . aPTT 12/16/2012 37  24 - 37 seconds Final   Comment:                                 IF BASELINE aPTT IS ELEVATED,                          SUGGEST PATIENT RISK ASSESSMENT                          BE USED TO DETERMINE APPROPRIATE                          ANTICOAGULANT THERAPY.  . WBC 12/16/2012 7.1  4.0 - 10.5 K/uL Final  . RBC 12/16/2012 4.54  3.87 - 5.11 MIL/uL Final  . Hemoglobin 12/16/2012 13.7  12.0 - 15.0 g/dL Final  . HCT 57/84/6962 39.3  36.0 - 46.0 % Final  . MCV 12/16/2012 86.6  78.0 - 100.0 fL Final  . MCH 12/16/2012 30.2  26.0 - 34.0 pg Final  . MCHC 12/16/2012 34.9  30.0 - 36.0 g/dL Final  . RDW 95/28/4132 13.1  11.5 - 15.5 % Final  . Platelets 12/16/2012 275  150 - 400 K/uL Final  . Sodium 12/16/2012 134* 135 - 145 mEq/L Final  . Potassium 12/16/2012 3.9  3.5 - 5.1 mEq/L Final  . Chloride 12/16/2012 97  96 - 112 mEq/L Final  . CO2 12/16/2012 27  19 - 32 mEq/L Final  . Glucose, Bld 12/16/2012 107* 70 - 99 mg/dL Final  . BUN 44/06/270 18  6 - 23 mg/dL Final  . Creatinine, Ser 12/16/2012 0.57  0.50 - 1.10 mg/dL Final  . Calcium 53/66/4403 9.7  8.4 - 10.5 mg/dL Final  . Total Protein 12/16/2012 7.6  6.0 - 8.3 g/dL Final  . Albumin 47/42/5956 3.8  3.5 - 5.2 g/dL Final  . AST 38/75/6433 18  0 - 37 U/L Final  . ALT 12/16/2012 16  0 - 35 U/L Final  . Alkaline Phosphatase 12/16/2012 86  39 - 117 U/L Final  . Total Bilirubin 12/16/2012 0.7  0.3 - 1.2 mg/dL Final  . GFR calc non Af Amer 12/16/2012 >90  >90 mL/min Final  . GFR calc Af Amer 12/16/2012 >90  >90 mL/min Final   Comment:                                 The eGFR has been calculated                          using the CKD EPI equation.                          This calculation has not been                          validated in all clinical                          situations.                          eGFR's persistently                          <90 mL/min signify                           possible Chronic Kidney Disease.  Marland Kitchen Prothrombin Time 12/16/2012 24.0* 11.6 - 15.2 seconds Final  . INR 12/16/2012 2.23* 0.00 - 1.49 Final  . ABO/RH(D) 12/16/2012 B NEG   Final  . Antibody Screen 12/16/2012 NEG   Final  . Sample Expiration 12/16/2012 12/25/2012   Final  . Color, Urine 12/16/2012 YELLOW  YELLOW Final  . APPearance 12/16/2012 CLEAR  CLEAR Final  . Specific Gravity, Urine 12/16/2012 1.023  1.005 - 1.030 Final  . pH 12/16/2012 7.5  5.0 - 8.0 Final  . Glucose, UA 12/16/2012 NEGATIVE  NEGATIVE mg/dL Final  . Hgb urine dipstick 12/16/2012 NEGATIVE  NEGATIVE Final  . Bilirubin Urine 12/16/2012 NEGATIVE  NEGATIVE Final  . Ketones, ur 12/16/2012 NEGATIVE  NEGATIVE mg/dL Final  . Protein, ur 16/03/9603 NEGATIVE  NEGATIVE mg/dL Final  . Urobilinogen, UA 12/16/2012 0.2  0.0 - 1.0 mg/dL Final  . Nitrite 54/02/8118 NEGATIVE  NEGATIVE Final  . Leukocytes, UA 12/16/2012 MODERATE* NEGATIVE Final  . MRSA, PCR 12/16/2012 NEGATIVE  NEGATIVE Final  . Staphylococcus aureus 12/16/2012 NEGATIVE  NEGATIVE Final   Comment:                                 The Xpert SA Assay (FDA                          approved for NASAL specimens                          in patients over 73 years of age),  is one component of                          a comprehensive surveillance                          program.  Test performance has                          been validated by [facility] for patients greater                          than or equal to 42 year old.                          It is not intended                          to diagnose infection nor to                          guide or monitor treatment.  . Squamous Epithelial / LPF 12/16/2012 RARE  RARE Final  . WBC, UA 12/16/2012 3-6  <3 WBC/hpf Final  . RBC / HPF 12/16/2012 0-2  <3 RBC/hpf Final  . Bacteria, UA 12/16/2012 FEW* RARE Final  . Specimen Description 12/16/2012  URINE, CLEAN CATCH   Final  . Special Requests 12/16/2012 NONE   Final  . Culture  Setup Time 12/16/2012 12/16/2012 14:53   Final  . Colony Count 12/16/2012 >=100,000 COLONIES/ML   Final  . Culture 12/16/2012 CITROBACTER KOSERI   Final  . Report Status 12/16/2012 12/17/2012 FINAL   Final  . Organism ID, Bacteria 12/16/2012 CITROBACTER KOSERI   Final     X-Rays:No results found.  EKG: Orders placed during the hospital encounter of 09/18/11  . EKG 12-LEAD  . EKG 12-LEAD  . EKG 12-LEAD  . EKG 12-LEAD  . EKG 12-LEAD  . EKG 12-LEAD  . EKG     Hospital Course: [name] is a 72 y.o. who was admitted to [facility]. They were brought to the operating room on 12/22/2012 and underwent Procedure(s): LEFT TOTAL KNEE ARTHROPLASTY.  Patient tolerated the procedure well and was later transferred to the recovery room and then to the orthopaedic floor for postoperative care.  They were given PO and IV analgesics for pain control following their surgery.  They were given 24 hours of postoperative antibiotics of  Anti-infectives   Start     Dose/Rate Route Frequency Ordered Stop   12/22/12 1400  ceFAZolin (ANCEF) IVPB 1 g/50 mL premix     1 g 100 mL/hr over 30 Minutes Intravenous Every 6 hours 12/22/12 1120 12/22/12 2124   12/22/12 0700  ceFAZolin (ANCEF) IVPB 2 g/50 mL premix    Comments:  Dose decreased to 2g per P&T policy for weight < 120kg. (weight documented as 95kg)   2 g 100 mL/hr over 30 Minutes Intravenous On call to O.R. 12/22/12 1610 12/22/12 0826     and started on DVT prophylaxis in the form of Lovenox and Coumadin.   PT and OT were ordered for total  joint protocol.  Discharge planning consulted to help with postop disposition and equipment needs.  Patient had a good night on the evening of surgery.  They started to get up OOB with therapy on day one. Hemovac drain was pulled without difficulty.  Continued to work with therapy into day two.  Dressing was changed on day two  and the incision was healing well.  Patient was seen in rounds and was ready to go home later that afternoon.  Discharge Medications: Prior to Admission medications   Medication Sig Start Date End Date Taking? Authorizing Provider  diltiazem (CARDIZEM CD) 120 MG 24 hr capsule Take 120 mg by mouth every morning.   Yes Historical Provider, MD  furosemide (LASIX) 40 MG tablet Take 40 mg by mouth every morning.    Yes Historical Provider, MD  losartan-hydrochlorothiazide (HYZAAR) 100-12.5 MG per tablet Take 1 tablet by mouth every morning.    Yes Historical Provider, MD  metFORMIN (GLUCOPHAGE-XR) 500 MG 24 hr tablet Take 500 mg by mouth daily with breakfast.   Yes Historical Provider, MD  metoprolol (LOPRESSOR) 50 MG tablet Take 25 mg by mouth 2 (two) times daily.   Yes Historical Provider, MD  potassium citrate (UROCIT-K) 10 MEQ (1080 MG) SR tablet Take 10 mEq by mouth 3 (three) times daily with meals.   Yes Historical Provider, MD  enoxaparin (LOVENOX) 30 MG/0.3ML injection Inject 0.3 mLs (30 mg total) into the skin every 12 (twelve) hours. Continue Lovenox injections until the INR is therapeutic at or greater than 2.0.  When INR reaches the therapeutic level of equal to or greater than 2.0, the patient may discontinue the Lovenox injections. 12/24/12   Alexzandrew Julien Girt, PA-C  methocarbamol (ROBAXIN) 500 MG tablet Take 1 tablet (500 mg total) by mouth every 6 (six) hours as needed. 12/24/12   Alexzandrew Perkins, PA-C  oxyCODONE (OXY IR/ROXICODONE) 5 MG immediate release tablet Take 1-2 tablets (5-10 mg total) by mouth every 3 (three) hours as needed. 12/24/12   Alexzandrew Perkins, PA-C  traMADol (ULTRAM) 50 MG tablet Take 1-2 tablets (50-100 mg total) by mouth every 6 (six) hours as needed (mild pain). 12/24/12   Alexzandrew Julien Girt, PA-C  warfarin (COUMADIN) 5 MG tablet Take 1 tablet (5 mg total) by mouth one time only at 6 PM. Take Coumadin for three weeks for postoperative protocol and then the  patient may resume their previous Coumadin home regimen.  The dose may need to be adjusted based upon the INR.  Please follow the INR and titrate Coumadin dose for a therapeutic range between 2.0 and 3.0 INR.  After completing the three weeks of Coumadin, the patient may resume their previous Coumadin home regimen. 12/24/12   Alexzandrew Julien Girt, PA-C    Diet: Cardiac diet and Diabetic diet Activity:WBAT Follow-up:in 2 weeks Disposition - Home Discharged Condition: good   Discharge Orders   Future Orders Complete By Expires     Call MD / Call 911  As directed     Comments:      If you experience chest pain or shortness of breath, CALL 911 and be transported to the hospital emergency room.  If you develope a fever above 101 F, pus (white drainage) or increased drainage or redness at the wound, or calf pain, call your surgeon's office.    Change dressing  As directed     Comments:      Change dressing daily with sterile 4 x 4 inch gauze dressing and apply TED hose. Do not  submerge the incision under water.    Constipation Prevention  As directed     Comments:      Drink plenty of fluids.  Prune juice may be helpful.  You may use a stool softener, such as Colace (over the counter) 100 mg twice a day.  Use MiraLax (over the counter) for constipation as needed.    Diet - low sodium heart healthy  As directed     Diet Carb Modified  As directed     Discharge instructions  As directed     Comments:      Pick up stool softner and laxative for home. Do not submerge incision under water. May shower. Continue to use ice for pain and swelling from surgery.  Take Coumadin for three weeks for postoperative protocol and then the patient may resume their previous Coumadin home regimen.  The dose may need to be adjusted based upon the INR.  Please follow the INR and titrate Coumadin dose for a therapeutic range between 2.0 and 3.0 INR.  After completing the three weeks of Coumadin, the patient may resume  their previous Coumadin home regimen.  Continue Lovenox injections until the INR is therapeutic at or greater than 2.0.  When INR reaches the therapeutic level of equal to or greater than 2.0, the patient may discontinue the Lovenox injections.    Do not put a pillow under the knee. Place it under the heel.  As directed     Do not sit on low chairs, stoools or toilet seats, as it may be difficult to get up from low surfaces  As directed     Driving restrictions  As directed     Comments:      No driving until released by the physician.    Increase activity slowly as tolerated  As directed     Lifting restrictions  As directed     Comments:      No lifting until released by the physician.    Patient may shower  As directed     Comments:      You may shower without a dressing once there is no drainage.  Do not wash over the wound.  If drainage remains, do not shower until drainage stops.    TED hose  As directed     Comments:      Use stockings (TED hose) for 3 weeks on both leg(s).  You may remove them at night for sleeping.    Weight bearing as tolerated  As directed         Medication List    STOP taking these medications       Aflibercept 2 MG/0.05ML Soln     calcium-vitamin D 500-200 MG-UNIT per tablet  Commonly known as:  OSCAL WITH D     cholecalciferol 1000 UNITS tablet  Commonly known as:  VITAMIN D     omega-3 acid ethyl esters 1 G capsule  Commonly known as:  LOVAZA     traMADol-acetaminophen 37.5-325 MG per tablet  Commonly known as:  ULTRACET      TAKE these medications       diltiazem 120 MG 24 hr capsule  Commonly known as:  CARDIZEM CD  Take 120 mg by mouth every morning.     enoxaparin 30 MG/0.3ML injection  Commonly known as:  LOVENOX  Inject 0.3 mLs (30 mg total) into the skin every 12 (twelve) hours. Continue Lovenox injections until the INR is therapeutic at or  greater than 2.0.  When INR reaches the therapeutic level of equal to or greater than  2.0, the patient may discontinue the Lovenox injections.     furosemide 40 MG tablet  Commonly known as:  LASIX  Take 40 mg by mouth every morning.     losartan-hydrochlorothiazide 100-12.5 MG per tablet  Commonly known as:  HYZAAR  Take 1 tablet by mouth every morning.     metFORMIN 500 MG 24 hr tablet  Commonly known as:  GLUCOPHAGE-XR  Take 500 mg by mouth daily with breakfast.     methocarbamol 500 MG tablet  Commonly known as:  ROBAXIN  Take 1 tablet (500 mg total) by mouth every 6 (six) hours as needed.     metoprolol 50 MG tablet  Commonly known as:  LOPRESSOR  Take 25 mg by mouth 2 (two) times daily.     oxyCODONE 5 MG immediate release tablet  Commonly known as:  Oxy IR/ROXICODONE  Take 1-2 tablets (5-10 mg total) by mouth every 3 (three) hours as needed.     potassium citrate 10 MEQ (1080 MG) SR tablet  Commonly known as:  UROCIT-K  Take 10 mEq by mouth 3 (three) times daily with meals.     traMADol 50 MG tablet  Commonly known as:  ULTRAM  Take 1-2 tablets (50-100 mg total) by mouth every 6 (six) hours as needed (mild pain).     warfarin 5 MG tablet  Commonly known as:  COUMADIN  Take 1 tablet (5 mg total) by mouth one time only at 6 PM. Take Coumadin for three weeks for postoperative protocol and then the patient may resume their previous Coumadin home regimen.  The dose may need to be adjusted based upon the INR.  Please follow the INR and titrate Coumadin dose for a therapeutic range between 2.0 and 3.0 INR.  After completing the three weeks of Coumadin, the patient may resume their previous Coumadin home regimen.           Follow-up Information   Follow up with Loanne Drilling, MD. Schedule an appointment as soon as possible for a visit on 01/06/2013. (Call [phone] tomorrow to make the appointment)    Contact information:   [address] [place] [address] [phone]       Signed: Patrica Duel 12/24/2012, 11:56 AM

## 2012-12-24 NOTE — Plan of Care (Signed)
Problem: Consults Goal: Diagnosis- Total Joint Replacement Outcome: Completed/Met Date Met:  12/24/12 Primary Total Knee

## 2013-03-04 ENCOUNTER — Encounter: Payer: Self-pay | Admitting: *Deleted

## 2013-03-04 ENCOUNTER — Encounter: Payer: Self-pay | Admitting: Cardiology

## 2013-03-10 ENCOUNTER — Encounter: Payer: Self-pay | Admitting: Cardiology

## 2013-03-10 DIAGNOSIS — Z9989 Dependence on other enabling machines and devices: Secondary | ICD-10-CM | POA: Insufficient documentation

## 2013-03-10 DIAGNOSIS — G4733 Obstructive sleep apnea (adult) (pediatric): Secondary | ICD-10-CM | POA: Insufficient documentation

## 2013-03-10 DIAGNOSIS — I10 Essential (primary) hypertension: Secondary | ICD-10-CM | POA: Insufficient documentation

## 2013-03-10 DIAGNOSIS — Z7901 Long term (current) use of anticoagulants: Secondary | ICD-10-CM | POA: Insufficient documentation

## 2013-03-10 DIAGNOSIS — I482 Chronic atrial fibrillation, unspecified: Secondary | ICD-10-CM | POA: Insufficient documentation

## 2013-03-11 ENCOUNTER — Encounter: Payer: Self-pay | Admitting: Cardiology

## 2013-03-11 ENCOUNTER — Ambulatory Visit (INDEPENDENT_AMBULATORY_CARE_PROVIDER_SITE_OTHER): Payer: Medicare Other | Admitting: Cardiology

## 2013-03-11 ENCOUNTER — Ambulatory Visit (INDEPENDENT_AMBULATORY_CARE_PROVIDER_SITE_OTHER): Payer: Medicare Other | Admitting: Pharmacist

## 2013-03-11 VITALS — BP 141/85 | HR 75 | Ht 65.0 in | Wt 203.0 lb

## 2013-03-11 DIAGNOSIS — I10 Essential (primary) hypertension: Secondary | ICD-10-CM

## 2013-03-11 DIAGNOSIS — Z7901 Long term (current) use of anticoagulants: Secondary | ICD-10-CM

## 2013-03-11 DIAGNOSIS — Z9989 Dependence on other enabling machines and devices: Secondary | ICD-10-CM

## 2013-03-11 DIAGNOSIS — I4891 Unspecified atrial fibrillation: Secondary | ICD-10-CM

## 2013-03-11 DIAGNOSIS — I482 Chronic atrial fibrillation, unspecified: Secondary | ICD-10-CM

## 2013-03-11 DIAGNOSIS — G4733 Obstructive sleep apnea (adult) (pediatric): Secondary | ICD-10-CM

## 2013-03-11 NOTE — Progress Notes (Signed)
[address] [place], [place]  [address] Phone: [phone] Fax:  [phone]  Date:  03/11/2013   ID:  [name], DOB Sep 25, 1940, MRN [id number]  PCP:  Lenora Boys, MD  Cardiologist:  Armanda Magic, MD     History of Present Illness: [name] is a 72 y.o. female with a history of HTN, OSA on CPAP, chronic afib and systemic anticoagulation.  She is doing well today.  She denies any chest pain, SOB, DOE, LE edema, dizziness, palpitations or syncope.  She tolerates her CPAP well and has no issues with the mask or pressure.  She feels rested in the am and has no daytime sleepiness.   Wt Readings from Last 3 Encounters:  12/22/12 210 lb (95.255 kg)  12/22/12 210 lb (95.255 kg)  12/16/12 210 lb 8 oz (95.482 kg)     Past Medical History  Diagnosis Date  . Arthritis     OA  . Hypertension   . Anticoagulated on warfarin CHRONIC    HX PAF  . Osteoarthritis of knee BILATERAL  . Ankle edema LEFT  . Ureteral stone LEFT  . Macular degeneration of both eyes     RECEIVES AVASTIN INJECTION TO LEFT EYE EVERY 6-8 WKS  . Anxiety   . Atrial fibrillation CARDIOLOGIST- DR Gloris Manchester TURNER LAST VISIT 02-20-11 (WILL REQUEST NOTE, STRESS TEST AND ECHO)    UNSUCCESSFUL CARDIOVERSION IN 2010--- CHRONIC COUMADIN THERAPY  . Sleep apnea     SETTING IS 4 ON CPAP  . Diabetes mellitus     ORAL MED TO "PREVENT DIABETES"--PT DOES NOT HAVE TO CHECK HER SUGARS  . History of kidney stones   . Obesity   . Atrial fibrillation   . OSA (obstructive sleep apnea)     ON CPAP 2013    Current Outpatient Prescriptions  Medication Sig Dispense Refill  . diltiazem (CARDIZEM CD) 120 MG 24 hr capsule Take 120 mg by mouth every morning.      . enoxaparin (LOVENOX) 30 MG/0.3ML injection Inject 0.3 mLs (30 mg total) into the skin every 12 (twelve) hours. Continue Lovenox injections until the INR is therapeutic at or greater than 2.0.  When INR reaches the therapeutic level of equal to or greater  than 2.0, the patient may discontinue the Lovenox injections.  5 Syringe  0  . furosemide (LASIX) 40 MG tablet Take 40 mg by mouth every morning.       Marland Kitchen losartan-hydrochlorothiazide (HYZAAR) 100-12.5 MG per tablet Take 1 tablet by mouth every morning.       . metFORMIN (GLUCOPHAGE-XR) 500 MG 24 hr tablet Take 500 mg by mouth daily with breakfast.      . methocarbamol (ROBAXIN) 500 MG tablet Take 1 tablet (500 mg total) by mouth every 6 (six) hours as needed.  80 tablet  0  . metoprolol (LOPRESSOR) 50 MG tablet Take 25 mg by mouth 2 (two) times daily.      Marland Kitchen oxyCODONE (OXY IR/ROXICODONE) 5 MG immediate release tablet Take 1-2 tablets (5-10 mg total) by mouth every 3 (three) hours as needed.  90 tablet  0  . potassium citrate (UROCIT-K) 10 MEQ (1080 MG) SR tablet Take 10 mEq by mouth 3 (three) times daily with meals.      . traMADol (ULTRAM) 50 MG tablet Take 1-2 tablets (50-100 mg total) by mouth every 6 (six) hours as needed (mild pain).  60 tablet  0  . warfarin (COUMADIN) 5 MG tablet  Take 1 tablet (5 mg total) by mouth one time only at 6 PM. Take Coumadin for three weeks for postoperative protocol and then the patient may resume their previous Coumadin home regimen.  The dose may need to be adjusted based upon the INR.  Please follow the INR and titrate Coumadin dose for a therapeutic range between 2.0 and 3.0 INR.  After completing the three weeks of Coumadin, the patient may resume their previous Coumadin home regimen.  45 tablet  0   No current facility-administered medications for this visit.    Allergies:    Allergies  Allergen Reactions  . Penicillins Rash    Social History:  The patient  reports that she has never smoked. She has never used smokeless tobacco. She reports that she does not drink alcohol or use illicit drugs.   Family History:  The patient's family history includes Heart disease in her father and mother.   ROS:  Please see the history of present illness.      All  other systems reviewed and negative.   PHYSICAL EXAM: VS:  There were no vitals taken for this visit. Well nourished, well developed, in no acute distress HEENT: normal Neck: no JVD Cardiac:  normal S1, S2; irregularly irregular; no murmur Lungs:  clear to auscultation bilaterally, no wheezing, rhonchi or rales Abd: soft, nontender, no hepatomegaly Ext: trace edema Skin: warm and dry Neuro:  CNs 2-12 intact, no focal abnormalities noted      ASSESSMENT AND PLAN:  1. Obstructive sleep apnea on CPAP  - continue on current CPAP settings  - will get d/l from DME 2. HTN  - Continue Hyzaar/lopressor/cardizem 3. Chronic atrial fibrillation  - continue lopressor/cardizem for rate control 4. Systemic anticoagulation 5. Chronic edema well controlled on Lasix  Followup with me in 6 months  Signed, Armanda Magic, MD 03/11/2013 10:41 AM

## 2013-03-11 NOTE — Patient Instructions (Signed)
Your physician recommends that you continue on your current medications as directed. Please refer to the Current Medication list given to you today.  Your physician recommends that you schedule a follow-up appointment in: 6 months with Dr. Turner.  

## 2013-03-20 ENCOUNTER — Ambulatory Visit (INDEPENDENT_AMBULATORY_CARE_PROVIDER_SITE_OTHER): Payer: BC Managed Care – PPO | Admitting: *Deleted

## 2013-03-20 DIAGNOSIS — I4891 Unspecified atrial fibrillation: Secondary | ICD-10-CM

## 2013-03-20 LAB — POCT INR: INR: 1.4

## 2013-03-27 ENCOUNTER — Ambulatory Visit (INDEPENDENT_AMBULATORY_CARE_PROVIDER_SITE_OTHER): Payer: Medicare Other | Admitting: Pharmacist

## 2013-03-27 DIAGNOSIS — I4891 Unspecified atrial fibrillation: Secondary | ICD-10-CM

## 2013-03-27 LAB — POCT INR: INR: 1.7

## 2013-04-10 ENCOUNTER — Ambulatory Visit (INDEPENDENT_AMBULATORY_CARE_PROVIDER_SITE_OTHER): Payer: Medicare Other | Admitting: Pharmacist

## 2013-04-10 DIAGNOSIS — I4891 Unspecified atrial fibrillation: Secondary | ICD-10-CM

## 2013-04-24 ENCOUNTER — Ambulatory Visit (INDEPENDENT_AMBULATORY_CARE_PROVIDER_SITE_OTHER): Payer: Medicare Other | Admitting: Pharmacist

## 2013-04-24 DIAGNOSIS — I4891 Unspecified atrial fibrillation: Secondary | ICD-10-CM

## 2013-05-08 ENCOUNTER — Ambulatory Visit (INDEPENDENT_AMBULATORY_CARE_PROVIDER_SITE_OTHER): Payer: Medicare Other | Admitting: *Deleted

## 2013-05-08 DIAGNOSIS — I4891 Unspecified atrial fibrillation: Secondary | ICD-10-CM

## 2013-05-08 LAB — POCT INR: INR: 2.1

## 2013-05-29 ENCOUNTER — Ambulatory Visit (INDEPENDENT_AMBULATORY_CARE_PROVIDER_SITE_OTHER): Payer: Medicare Other | Admitting: Pharmacist

## 2013-05-29 DIAGNOSIS — I4891 Unspecified atrial fibrillation: Secondary | ICD-10-CM

## 2013-05-29 LAB — POCT INR: INR: 2.2

## 2013-06-26 ENCOUNTER — Ambulatory Visit (INDEPENDENT_AMBULATORY_CARE_PROVIDER_SITE_OTHER): Payer: Medicare Other | Admitting: Pharmacist

## 2013-06-26 DIAGNOSIS — I4891 Unspecified atrial fibrillation: Secondary | ICD-10-CM

## 2013-06-26 LAB — POCT INR: INR: 2.3

## 2013-08-05 ENCOUNTER — Ambulatory Visit (INDEPENDENT_AMBULATORY_CARE_PROVIDER_SITE_OTHER): Payer: Medicare Other | Admitting: Pharmacist

## 2013-08-05 DIAGNOSIS — I4891 Unspecified atrial fibrillation: Secondary | ICD-10-CM

## 2013-08-05 LAB — POCT INR: INR: 2.1

## 2013-09-07 ENCOUNTER — Other Ambulatory Visit: Payer: Self-pay | Admitting: Cardiology

## 2013-09-18 ENCOUNTER — Ambulatory Visit (INDEPENDENT_AMBULATORY_CARE_PROVIDER_SITE_OTHER): Payer: Medicare Other | Admitting: *Deleted

## 2013-09-18 DIAGNOSIS — I4891 Unspecified atrial fibrillation: Secondary | ICD-10-CM

## 2013-09-18 LAB — POCT INR: INR: 2.3

## 2013-09-25 ENCOUNTER — Other Ambulatory Visit (HOSPITAL_COMMUNITY): Payer: Self-pay | Admitting: Family Medicine

## 2013-09-25 DIAGNOSIS — Z780 Asymptomatic menopausal state: Secondary | ICD-10-CM

## 2013-09-29 ENCOUNTER — Other Ambulatory Visit (HOSPITAL_COMMUNITY): Payer: Self-pay | Admitting: Family Medicine

## 2013-09-29 DIAGNOSIS — Z1231 Encounter for screening mammogram for malignant neoplasm of breast: Secondary | ICD-10-CM

## 2013-10-07 ENCOUNTER — Ambulatory Visit (HOSPITAL_COMMUNITY)
Admission: RE | Admit: 2013-10-07 | Discharge: 2013-10-07 | Disposition: A | Discharge status: Home / Self Care | Payer: Medicare Other | Source: Ambulatory Visit | Attending: Family Medicine | Admitting: Family Medicine

## 2013-10-07 DIAGNOSIS — Z780 Asymptomatic menopausal state: Secondary | ICD-10-CM

## 2013-10-07 DIAGNOSIS — Z1231 Encounter for screening mammogram for malignant neoplasm of breast: Secondary | ICD-10-CM | POA: Insufficient documentation

## 2013-10-09 ENCOUNTER — Ambulatory Visit (HOSPITAL_COMMUNITY): Payer: Medicare Other

## 2013-10-22 ENCOUNTER — Telehealth: Payer: Self-pay | Admitting: Pharmacist Clinician (PhC)/ Clinical Pharmacy Specialist

## 2013-10-22 NOTE — Telephone Encounter (Signed)
Faxed clearance form to [facility] for [name] to have screening colonoscopy 12/15/13.  Ok to hold warfarin for procedure

## 2013-10-30 ENCOUNTER — Ambulatory Visit (INDEPENDENT_AMBULATORY_CARE_PROVIDER_SITE_OTHER): Payer: Medicare Other | Admitting: Surgery

## 2013-10-30 DIAGNOSIS — I4891 Unspecified atrial fibrillation: Secondary | ICD-10-CM

## 2013-10-30 LAB — POCT INR: INR: 2.6

## 2013-12-24 ENCOUNTER — Ambulatory Visit (INDEPENDENT_AMBULATORY_CARE_PROVIDER_SITE_OTHER): Payer: Medicare Other | Admitting: *Deleted

## 2013-12-24 DIAGNOSIS — I4891 Unspecified atrial fibrillation: Secondary | ICD-10-CM

## 2013-12-24 LAB — POCT INR: INR: 1.8

## 2014-01-08 ENCOUNTER — Ambulatory Visit (INDEPENDENT_AMBULATORY_CARE_PROVIDER_SITE_OTHER): Payer: Medicare Other | Admitting: Pharmacist

## 2014-01-08 DIAGNOSIS — I4891 Unspecified atrial fibrillation: Secondary | ICD-10-CM

## 2014-01-08 LAB — POCT INR: INR: 3

## 2014-02-05 ENCOUNTER — Ambulatory Visit (INDEPENDENT_AMBULATORY_CARE_PROVIDER_SITE_OTHER): Payer: Medicare Other

## 2014-02-05 DIAGNOSIS — I4891 Unspecified atrial fibrillation: Secondary | ICD-10-CM

## 2014-02-05 LAB — POCT INR: INR: 2.3

## 2014-03-12 ENCOUNTER — Ambulatory Visit (INDEPENDENT_AMBULATORY_CARE_PROVIDER_SITE_OTHER): Payer: Medicare Other | Admitting: Pharmacist

## 2014-03-12 DIAGNOSIS — I4891 Unspecified atrial fibrillation: Secondary | ICD-10-CM

## 2014-03-12 LAB — POCT INR: INR: 2.1

## 2014-04-23 ENCOUNTER — Ambulatory Visit (INDEPENDENT_AMBULATORY_CARE_PROVIDER_SITE_OTHER): Payer: Medicare Other

## 2014-04-23 DIAGNOSIS — I4891 Unspecified atrial fibrillation: Secondary | ICD-10-CM

## 2014-04-23 LAB — POCT INR: INR: 2.3

## 2014-05-12 ENCOUNTER — Other Ambulatory Visit: Payer: Self-pay

## 2014-05-12 DIAGNOSIS — G4730 Sleep apnea, unspecified: Secondary | ICD-10-CM

## 2014-06-11 ENCOUNTER — Ambulatory Visit (INDEPENDENT_AMBULATORY_CARE_PROVIDER_SITE_OTHER): Payer: Medicare Other | Admitting: *Deleted

## 2014-06-11 DIAGNOSIS — I4891 Unspecified atrial fibrillation: Secondary | ICD-10-CM

## 2014-06-11 LAB — POCT INR: INR: 2.5

## 2014-07-16 ENCOUNTER — Ambulatory Visit (INDEPENDENT_AMBULATORY_CARE_PROVIDER_SITE_OTHER): Payer: Medicare Other | Admitting: Pharmacist

## 2014-07-16 DIAGNOSIS — I4891 Unspecified atrial fibrillation: Secondary | ICD-10-CM

## 2014-07-16 LAB — POCT INR: INR: 3.4

## 2014-08-13 ENCOUNTER — Ambulatory Visit (INDEPENDENT_AMBULATORY_CARE_PROVIDER_SITE_OTHER): Payer: Medicare Other | Admitting: *Deleted

## 2014-08-13 DIAGNOSIS — I4891 Unspecified atrial fibrillation: Secondary | ICD-10-CM | POA: Diagnosis not present

## 2014-08-13 LAB — POCT INR: INR: 2.7

## 2014-09-24 ENCOUNTER — Ambulatory Visit (INDEPENDENT_AMBULATORY_CARE_PROVIDER_SITE_OTHER): Payer: Medicare Other | Admitting: *Deleted

## 2014-09-24 DIAGNOSIS — I4891 Unspecified atrial fibrillation: Secondary | ICD-10-CM | POA: Diagnosis not present

## 2014-09-24 LAB — POCT INR: INR: 2.5

## 2014-11-05 ENCOUNTER — Ambulatory Visit (INDEPENDENT_AMBULATORY_CARE_PROVIDER_SITE_OTHER): Payer: Medicare Other | Admitting: *Deleted

## 2014-11-05 DIAGNOSIS — Z5181 Encounter for therapeutic drug level monitoring: Secondary | ICD-10-CM | POA: Diagnosis not present

## 2014-11-05 DIAGNOSIS — I4891 Unspecified atrial fibrillation: Secondary | ICD-10-CM

## 2014-11-05 LAB — POCT INR: INR: 4.3

## 2014-11-16 ENCOUNTER — Other Ambulatory Visit (HOSPITAL_COMMUNITY): Payer: Self-pay | Admitting: Family Medicine

## 2014-11-16 DIAGNOSIS — Z1231 Encounter for screening mammogram for malignant neoplasm of breast: Secondary | ICD-10-CM

## 2014-11-19 ENCOUNTER — Ambulatory Visit (INDEPENDENT_AMBULATORY_CARE_PROVIDER_SITE_OTHER): Payer: Medicare Other | Admitting: *Deleted

## 2014-11-19 ENCOUNTER — Ambulatory Visit (HOSPITAL_COMMUNITY)
Admission: RE | Admit: 2014-11-19 | Discharge: 2014-11-19 | Disposition: A | Discharge status: Home / Self Care | Payer: Medicare Other | Source: Ambulatory Visit | Attending: Family Medicine | Admitting: Family Medicine

## 2014-11-19 DIAGNOSIS — I4891 Unspecified atrial fibrillation: Secondary | ICD-10-CM

## 2014-11-19 DIAGNOSIS — Z1231 Encounter for screening mammogram for malignant neoplasm of breast: Secondary | ICD-10-CM

## 2014-11-19 DIAGNOSIS — Z5181 Encounter for therapeutic drug level monitoring: Secondary | ICD-10-CM

## 2014-11-19 LAB — POCT INR: INR: 2.5

## 2014-12-17 ENCOUNTER — Ambulatory Visit (INDEPENDENT_AMBULATORY_CARE_PROVIDER_SITE_OTHER): Payer: Medicare Other | Admitting: *Deleted

## 2014-12-17 DIAGNOSIS — I4891 Unspecified atrial fibrillation: Secondary | ICD-10-CM | POA: Diagnosis not present

## 2014-12-17 DIAGNOSIS — Z5181 Encounter for therapeutic drug level monitoring: Secondary | ICD-10-CM

## 2014-12-17 LAB — POCT INR: INR: 2.5

## 2015-01-14 ENCOUNTER — Ambulatory Visit (INDEPENDENT_AMBULATORY_CARE_PROVIDER_SITE_OTHER): Payer: Medicare Other

## 2015-01-14 DIAGNOSIS — Z5181 Encounter for therapeutic drug level monitoring: Secondary | ICD-10-CM | POA: Diagnosis not present

## 2015-01-14 DIAGNOSIS — I4891 Unspecified atrial fibrillation: Secondary | ICD-10-CM | POA: Diagnosis not present

## 2015-01-14 LAB — POCT INR: INR: 2.7

## 2015-02-25 ENCOUNTER — Ambulatory Visit (INDEPENDENT_AMBULATORY_CARE_PROVIDER_SITE_OTHER): Payer: Medicare Other

## 2015-02-25 DIAGNOSIS — Z5181 Encounter for therapeutic drug level monitoring: Secondary | ICD-10-CM | POA: Diagnosis not present

## 2015-02-25 DIAGNOSIS — I4891 Unspecified atrial fibrillation: Secondary | ICD-10-CM | POA: Diagnosis not present

## 2015-02-25 LAB — POCT INR: INR: 2.9

## 2015-04-08 ENCOUNTER — Ambulatory Visit (INDEPENDENT_AMBULATORY_CARE_PROVIDER_SITE_OTHER): Payer: Medicare Other | Admitting: *Deleted

## 2015-04-08 DIAGNOSIS — Z5181 Encounter for therapeutic drug level monitoring: Secondary | ICD-10-CM | POA: Diagnosis not present

## 2015-04-08 DIAGNOSIS — I4891 Unspecified atrial fibrillation: Secondary | ICD-10-CM

## 2015-04-08 LAB — POCT INR: INR: 2.6

## 2015-05-20 ENCOUNTER — Ambulatory Visit (INDEPENDENT_AMBULATORY_CARE_PROVIDER_SITE_OTHER): Payer: Medicare Other | Admitting: *Deleted

## 2015-05-20 DIAGNOSIS — I4891 Unspecified atrial fibrillation: Secondary | ICD-10-CM

## 2015-05-20 DIAGNOSIS — Z5181 Encounter for therapeutic drug level monitoring: Secondary | ICD-10-CM

## 2015-05-20 LAB — POCT INR: INR: 3

## 2015-07-01 ENCOUNTER — Other Ambulatory Visit (HOSPITAL_COMMUNITY)
Admission: RE | Admit: 2015-07-01 | Discharge: 2015-07-01 | Disposition: A | Discharge status: Home / Self Care | Payer: Medicare Other | Source: Ambulatory Visit | Attending: Cardiovascular Disease | Admitting: Cardiovascular Disease

## 2015-07-01 ENCOUNTER — Ambulatory Visit (INDEPENDENT_AMBULATORY_CARE_PROVIDER_SITE_OTHER): Payer: Medicare Other

## 2015-07-01 DIAGNOSIS — Z5181 Encounter for therapeutic drug level monitoring: Secondary | ICD-10-CM | POA: Diagnosis present

## 2015-07-01 DIAGNOSIS — I4891 Unspecified atrial fibrillation: Secondary | ICD-10-CM | POA: Diagnosis not present

## 2015-07-01 LAB — PROTIME-INR
INR: 4.11 — ABNORMAL HIGH (ref 0.00–1.49)
PROTHROMBIN TIME: 38.8 s — AB (ref 11.6–15.2)

## 2015-07-01 LAB — POCT INR: INR: 6.4

## 2015-07-08 ENCOUNTER — Ambulatory Visit (INDEPENDENT_AMBULATORY_CARE_PROVIDER_SITE_OTHER): Payer: Medicare Other | Admitting: *Deleted

## 2015-07-08 DIAGNOSIS — Z5181 Encounter for therapeutic drug level monitoring: Secondary | ICD-10-CM

## 2015-07-08 DIAGNOSIS — I4891 Unspecified atrial fibrillation: Secondary | ICD-10-CM

## 2015-07-08 LAB — POCT INR: INR: 3.7

## 2015-07-11 DIAGNOSIS — H4321 Crystalline deposits in vitreous body, right eye: Secondary | ICD-10-CM | POA: Diagnosis not present

## 2015-07-11 DIAGNOSIS — H353112 Nonexudative age-related macular degeneration, right eye, intermediate dry stage: Secondary | ICD-10-CM | POA: Diagnosis not present

## 2015-07-11 DIAGNOSIS — H353221 Exudative age-related macular degeneration, left eye, with active choroidal neovascularization: Secondary | ICD-10-CM | POA: Diagnosis not present

## 2015-07-11 DIAGNOSIS — H43813 Vitreous degeneration, bilateral: Secondary | ICD-10-CM | POA: Diagnosis not present

## 2015-07-18 ENCOUNTER — Ambulatory Visit (INDEPENDENT_AMBULATORY_CARE_PROVIDER_SITE_OTHER): Payer: Medicare Other | Admitting: Pharmacist

## 2015-07-18 DIAGNOSIS — I4891 Unspecified atrial fibrillation: Secondary | ICD-10-CM

## 2015-07-18 DIAGNOSIS — Z5181 Encounter for therapeutic drug level monitoring: Secondary | ICD-10-CM | POA: Diagnosis not present

## 2015-07-18 LAB — POCT INR: INR: 2.7

## 2015-08-01 ENCOUNTER — Ambulatory Visit (INDEPENDENT_AMBULATORY_CARE_PROVIDER_SITE_OTHER): Payer: Medicare Other | Admitting: Pharmacist

## 2015-08-01 DIAGNOSIS — Z5181 Encounter for therapeutic drug level monitoring: Secondary | ICD-10-CM | POA: Diagnosis not present

## 2015-08-01 DIAGNOSIS — I4891 Unspecified atrial fibrillation: Secondary | ICD-10-CM

## 2015-08-01 LAB — POCT INR: INR: 2.1

## 2015-08-29 ENCOUNTER — Ambulatory Visit (INDEPENDENT_AMBULATORY_CARE_PROVIDER_SITE_OTHER): Payer: Medicare Other | Admitting: Pharmacist

## 2015-08-29 DIAGNOSIS — Z5181 Encounter for therapeutic drug level monitoring: Secondary | ICD-10-CM

## 2015-08-29 DIAGNOSIS — I4891 Unspecified atrial fibrillation: Secondary | ICD-10-CM | POA: Diagnosis not present

## 2015-08-29 LAB — POCT INR: INR: 1.9

## 2015-09-19 DIAGNOSIS — H43813 Vitreous degeneration, bilateral: Secondary | ICD-10-CM | POA: Diagnosis not present

## 2015-09-19 DIAGNOSIS — H353221 Exudative age-related macular degeneration, left eye, with active choroidal neovascularization: Secondary | ICD-10-CM | POA: Diagnosis not present

## 2015-09-19 DIAGNOSIS — H353112 Nonexudative age-related macular degeneration, right eye, intermediate dry stage: Secondary | ICD-10-CM | POA: Diagnosis not present

## 2015-09-19 DIAGNOSIS — H34232 Retinal artery branch occlusion, left eye: Secondary | ICD-10-CM | POA: Diagnosis not present

## 2015-09-26 ENCOUNTER — Ambulatory Visit (INDEPENDENT_AMBULATORY_CARE_PROVIDER_SITE_OTHER): Payer: Medicare Other | Admitting: Pharmacist

## 2015-09-26 DIAGNOSIS — Z5181 Encounter for therapeutic drug level monitoring: Secondary | ICD-10-CM | POA: Diagnosis not present

## 2015-09-26 DIAGNOSIS — I4891 Unspecified atrial fibrillation: Secondary | ICD-10-CM

## 2015-09-26 LAB — POCT INR: INR: 2.1

## 2015-10-04 DIAGNOSIS — G4733 Obstructive sleep apnea (adult) (pediatric): Secondary | ICD-10-CM | POA: Diagnosis not present

## 2015-10-07 DIAGNOSIS — I119 Hypertensive heart disease without heart failure: Secondary | ICD-10-CM | POA: Diagnosis not present

## 2015-10-07 DIAGNOSIS — Z5181 Encounter for therapeutic drug level monitoring: Secondary | ICD-10-CM | POA: Diagnosis not present

## 2015-10-07 DIAGNOSIS — I4891 Unspecified atrial fibrillation: Secondary | ICD-10-CM | POA: Diagnosis not present

## 2015-10-07 DIAGNOSIS — M1990 Unspecified osteoarthritis, unspecified site: Secondary | ICD-10-CM | POA: Diagnosis not present

## 2015-10-07 DIAGNOSIS — R7303 Prediabetes: Secondary | ICD-10-CM | POA: Diagnosis not present

## 2015-10-07 DIAGNOSIS — E785 Hyperlipidemia, unspecified: Secondary | ICD-10-CM | POA: Diagnosis not present

## 2015-10-07 DIAGNOSIS — H34232 Retinal artery branch occlusion, left eye: Secondary | ICD-10-CM | POA: Diagnosis not present

## 2015-10-24 ENCOUNTER — Ambulatory Visit (INDEPENDENT_AMBULATORY_CARE_PROVIDER_SITE_OTHER): Payer: Medicare Other | Admitting: *Deleted

## 2015-10-24 ENCOUNTER — Other Ambulatory Visit (HOSPITAL_COMMUNITY): Payer: Self-pay | Admitting: Physician Assistant

## 2015-10-24 ENCOUNTER — Ambulatory Visit (HOSPITAL_COMMUNITY)
Admission: RE | Admit: 2015-10-24 | Discharge: 2015-10-24 | Disposition: A | Discharge status: Home / Self Care | Payer: Medicare Other | Source: Ambulatory Visit | Attending: Cardiovascular Disease | Admitting: Cardiovascular Disease

## 2015-10-24 DIAGNOSIS — H34232 Retinal artery branch occlusion, left eye: Secondary | ICD-10-CM

## 2015-10-24 DIAGNOSIS — Z5181 Encounter for therapeutic drug level monitoring: Secondary | ICD-10-CM

## 2015-10-24 DIAGNOSIS — I4891 Unspecified atrial fibrillation: Secondary | ICD-10-CM

## 2015-10-24 DIAGNOSIS — I6523 Occlusion and stenosis of bilateral carotid arteries: Secondary | ICD-10-CM | POA: Insufficient documentation

## 2015-10-24 LAB — POCT INR: INR: 1.6

## 2015-11-07 ENCOUNTER — Ambulatory Visit (INDEPENDENT_AMBULATORY_CARE_PROVIDER_SITE_OTHER): Payer: Medicare Other | Admitting: *Deleted

## 2015-11-07 DIAGNOSIS — I4891 Unspecified atrial fibrillation: Secondary | ICD-10-CM

## 2015-11-07 DIAGNOSIS — Z5181 Encounter for therapeutic drug level monitoring: Secondary | ICD-10-CM

## 2015-11-07 LAB — POCT INR: INR: 2.4

## 2015-11-25 DIAGNOSIS — H353112 Nonexudative age-related macular degeneration, right eye, intermediate dry stage: Secondary | ICD-10-CM | POA: Diagnosis not present

## 2015-11-25 DIAGNOSIS — H353221 Exudative age-related macular degeneration, left eye, with active choroidal neovascularization: Secondary | ICD-10-CM | POA: Diagnosis not present

## 2015-11-25 DIAGNOSIS — H34212 Partial retinal artery occlusion, left eye: Secondary | ICD-10-CM | POA: Diagnosis not present

## 2015-11-25 DIAGNOSIS — H43813 Vitreous degeneration, bilateral: Secondary | ICD-10-CM | POA: Diagnosis not present

## 2015-11-28 ENCOUNTER — Ambulatory Visit (INDEPENDENT_AMBULATORY_CARE_PROVIDER_SITE_OTHER): Payer: Medicare Other | Admitting: *Deleted

## 2015-11-28 DIAGNOSIS — Z5181 Encounter for therapeutic drug level monitoring: Secondary | ICD-10-CM | POA: Diagnosis not present

## 2015-11-28 DIAGNOSIS — I4891 Unspecified atrial fibrillation: Secondary | ICD-10-CM

## 2015-11-28 LAB — POCT INR: INR: 2.4

## 2015-12-16 DIAGNOSIS — Z96651 Presence of right artificial knee joint: Secondary | ICD-10-CM | POA: Diagnosis not present

## 2015-12-16 DIAGNOSIS — Z96652 Presence of left artificial knee joint: Secondary | ICD-10-CM | POA: Diagnosis not present

## 2015-12-16 DIAGNOSIS — Z96653 Presence of artificial knee joint, bilateral: Secondary | ICD-10-CM | POA: Diagnosis not present

## 2015-12-16 DIAGNOSIS — Z471 Aftercare following joint replacement surgery: Secondary | ICD-10-CM | POA: Diagnosis not present

## 2016-01-02 ENCOUNTER — Ambulatory Visit (INDEPENDENT_AMBULATORY_CARE_PROVIDER_SITE_OTHER): Payer: Medicare Other | Admitting: *Deleted

## 2016-01-02 DIAGNOSIS — I4891 Unspecified atrial fibrillation: Secondary | ICD-10-CM | POA: Diagnosis not present

## 2016-01-02 DIAGNOSIS — Z5181 Encounter for therapeutic drug level monitoring: Secondary | ICD-10-CM

## 2016-01-02 LAB — POCT INR: INR: 2

## 2016-02-01 DIAGNOSIS — H353221 Exudative age-related macular degeneration, left eye, with active choroidal neovascularization: Secondary | ICD-10-CM | POA: Diagnosis not present

## 2016-02-01 DIAGNOSIS — H34212 Partial retinal artery occlusion, left eye: Secondary | ICD-10-CM | POA: Diagnosis not present

## 2016-02-01 DIAGNOSIS — H4321 Crystalline deposits in vitreous body, right eye: Secondary | ICD-10-CM | POA: Diagnosis not present

## 2016-02-01 DIAGNOSIS — H353112 Nonexudative age-related macular degeneration, right eye, intermediate dry stage: Secondary | ICD-10-CM | POA: Diagnosis not present

## 2016-02-15 ENCOUNTER — Ambulatory Visit (INDEPENDENT_AMBULATORY_CARE_PROVIDER_SITE_OTHER): Payer: Medicare Other | Admitting: *Deleted

## 2016-02-15 DIAGNOSIS — Z5181 Encounter for therapeutic drug level monitoring: Secondary | ICD-10-CM

## 2016-02-15 DIAGNOSIS — I4891 Unspecified atrial fibrillation: Secondary | ICD-10-CM

## 2016-02-15 LAB — POCT INR: INR: 1.7

## 2016-02-28 ENCOUNTER — Other Ambulatory Visit: Payer: Self-pay | Admitting: Physician Assistant

## 2016-02-28 DIAGNOSIS — Z1231 Encounter for screening mammogram for malignant neoplasm of breast: Secondary | ICD-10-CM

## 2016-03-01 ENCOUNTER — Ambulatory Visit (INDEPENDENT_AMBULATORY_CARE_PROVIDER_SITE_OTHER): Payer: Medicare Other | Admitting: *Deleted

## 2016-03-01 DIAGNOSIS — Z5181 Encounter for therapeutic drug level monitoring: Secondary | ICD-10-CM

## 2016-03-01 DIAGNOSIS — I4891 Unspecified atrial fibrillation: Secondary | ICD-10-CM

## 2016-03-01 LAB — POCT INR: INR: 2.3

## 2016-03-08 ENCOUNTER — Ambulatory Visit
Admission: RE | Admit: 2016-03-08 | Discharge: 2016-03-08 | Disposition: A | Discharge status: Home / Self Care | Payer: Medicare Other | Source: Ambulatory Visit | Attending: Physician Assistant | Admitting: Physician Assistant

## 2016-03-08 DIAGNOSIS — Z1231 Encounter for screening mammogram for malignant neoplasm of breast: Secondary | ICD-10-CM

## 2016-03-22 ENCOUNTER — Ambulatory Visit (INDEPENDENT_AMBULATORY_CARE_PROVIDER_SITE_OTHER): Payer: Medicare Other | Admitting: *Deleted

## 2016-03-22 DIAGNOSIS — I4891 Unspecified atrial fibrillation: Secondary | ICD-10-CM | POA: Diagnosis not present

## 2016-03-22 DIAGNOSIS — Z5181 Encounter for therapeutic drug level monitoring: Secondary | ICD-10-CM | POA: Diagnosis not present

## 2016-03-22 LAB — POCT INR: INR: 2.4

## 2016-04-06 DIAGNOSIS — M1990 Unspecified osteoarthritis, unspecified site: Secondary | ICD-10-CM | POA: Diagnosis not present

## 2016-04-06 DIAGNOSIS — R7303 Prediabetes: Secondary | ICD-10-CM | POA: Diagnosis not present

## 2016-04-06 DIAGNOSIS — I119 Hypertensive heart disease without heart failure: Secondary | ICD-10-CM | POA: Diagnosis not present

## 2016-04-06 DIAGNOSIS — Z23 Encounter for immunization: Secondary | ICD-10-CM | POA: Diagnosis not present

## 2016-04-11 DIAGNOSIS — H34212 Partial retinal artery occlusion, left eye: Secondary | ICD-10-CM | POA: Diagnosis not present

## 2016-04-11 DIAGNOSIS — H353112 Nonexudative age-related macular degeneration, right eye, intermediate dry stage: Secondary | ICD-10-CM | POA: Diagnosis not present

## 2016-04-11 DIAGNOSIS — H35421 Microcystoid degeneration of retina, right eye: Secondary | ICD-10-CM | POA: Diagnosis not present

## 2016-04-11 DIAGNOSIS — H353221 Exudative age-related macular degeneration, left eye, with active choroidal neovascularization: Secondary | ICD-10-CM | POA: Diagnosis not present

## 2016-04-18 ENCOUNTER — Ambulatory Visit (INDEPENDENT_AMBULATORY_CARE_PROVIDER_SITE_OTHER): Payer: Medicare Other | Admitting: *Deleted

## 2016-04-18 DIAGNOSIS — Z5181 Encounter for therapeutic drug level monitoring: Secondary | ICD-10-CM | POA: Diagnosis not present

## 2016-04-18 DIAGNOSIS — I4891 Unspecified atrial fibrillation: Secondary | ICD-10-CM | POA: Diagnosis not present

## 2016-04-18 LAB — POCT INR: INR: 2.1

## 2016-04-23 DIAGNOSIS — G4733 Obstructive sleep apnea (adult) (pediatric): Secondary | ICD-10-CM | POA: Diagnosis not present

## 2016-05-23 ENCOUNTER — Ambulatory Visit (INDEPENDENT_AMBULATORY_CARE_PROVIDER_SITE_OTHER): Payer: Medicare Other | Admitting: *Deleted

## 2016-05-23 DIAGNOSIS — I4891 Unspecified atrial fibrillation: Secondary | ICD-10-CM | POA: Diagnosis not present

## 2016-05-23 DIAGNOSIS — Z5181 Encounter for therapeutic drug level monitoring: Secondary | ICD-10-CM

## 2016-05-23 LAB — POCT INR: INR: 2.8

## 2016-06-26 DIAGNOSIS — H353221 Exudative age-related macular degeneration, left eye, with active choroidal neovascularization: Secondary | ICD-10-CM | POA: Diagnosis not present

## 2016-06-26 DIAGNOSIS — H353122 Nonexudative age-related macular degeneration, left eye, intermediate dry stage: Secondary | ICD-10-CM | POA: Diagnosis not present

## 2016-06-26 DIAGNOSIS — H34212 Partial retinal artery occlusion, left eye: Secondary | ICD-10-CM | POA: Diagnosis not present

## 2016-06-26 DIAGNOSIS — H35421 Microcystoid degeneration of retina, right eye: Secondary | ICD-10-CM | POA: Diagnosis not present

## 2016-07-04 ENCOUNTER — Ambulatory Visit (INDEPENDENT_AMBULATORY_CARE_PROVIDER_SITE_OTHER): Payer: Medicare Other | Admitting: *Deleted

## 2016-07-04 ENCOUNTER — Encounter (INDEPENDENT_AMBULATORY_CARE_PROVIDER_SITE_OTHER): Payer: Self-pay

## 2016-07-04 DIAGNOSIS — I4891 Unspecified atrial fibrillation: Secondary | ICD-10-CM

## 2016-07-04 DIAGNOSIS — Z5181 Encounter for therapeutic drug level monitoring: Secondary | ICD-10-CM | POA: Diagnosis not present

## 2016-07-04 LAB — POCT INR: INR: 1.9

## 2016-07-27 DIAGNOSIS — G4733 Obstructive sleep apnea (adult) (pediatric): Secondary | ICD-10-CM | POA: Diagnosis not present

## 2016-08-15 ENCOUNTER — Ambulatory Visit (INDEPENDENT_AMBULATORY_CARE_PROVIDER_SITE_OTHER): Payer: Medicare Other | Admitting: *Deleted

## 2016-08-15 DIAGNOSIS — I4891 Unspecified atrial fibrillation: Secondary | ICD-10-CM

## 2016-08-15 DIAGNOSIS — Z5181 Encounter for therapeutic drug level monitoring: Secondary | ICD-10-CM

## 2016-08-15 LAB — POCT INR: INR: 2.3

## 2016-08-27 DIAGNOSIS — H527 Unspecified disorder of refraction: Secondary | ICD-10-CM | POA: Diagnosis not present

## 2016-08-27 DIAGNOSIS — E119 Type 2 diabetes mellitus without complications: Secondary | ICD-10-CM | POA: Diagnosis not present

## 2016-08-27 DIAGNOSIS — H25811 Combined forms of age-related cataract, right eye: Secondary | ICD-10-CM | POA: Diagnosis not present

## 2016-08-27 DIAGNOSIS — H52221 Regular astigmatism, right eye: Secondary | ICD-10-CM | POA: Diagnosis not present

## 2016-09-11 DIAGNOSIS — H353113 Nonexudative age-related macular degeneration, right eye, advanced atrophic without subfoveal involvement: Secondary | ICD-10-CM | POA: Diagnosis not present

## 2016-09-11 DIAGNOSIS — H4321 Crystalline deposits in vitreous body, right eye: Secondary | ICD-10-CM | POA: Diagnosis not present

## 2016-09-11 DIAGNOSIS — H353221 Exudative age-related macular degeneration, left eye, with active choroidal neovascularization: Secondary | ICD-10-CM | POA: Diagnosis not present

## 2016-09-11 DIAGNOSIS — H43813 Vitreous degeneration, bilateral: Secondary | ICD-10-CM | POA: Diagnosis not present

## 2016-09-25 DIAGNOSIS — M154 Erosive (osteo)arthritis: Secondary | ICD-10-CM | POA: Diagnosis not present

## 2016-09-25 DIAGNOSIS — I10 Essential (primary) hypertension: Secondary | ICD-10-CM | POA: Diagnosis not present

## 2016-09-25 DIAGNOSIS — H353221 Exudative age-related macular degeneration, left eye, with active choroidal neovascularization: Secondary | ICD-10-CM | POA: Diagnosis not present

## 2016-09-25 DIAGNOSIS — H527 Unspecified disorder of refraction: Secondary | ICD-10-CM | POA: Diagnosis not present

## 2016-09-25 DIAGNOSIS — E119 Type 2 diabetes mellitus without complications: Secondary | ICD-10-CM | POA: Diagnosis not present

## 2016-09-25 DIAGNOSIS — I4891 Unspecified atrial fibrillation: Secondary | ICD-10-CM | POA: Diagnosis not present

## 2016-09-25 DIAGNOSIS — Z9989 Dependence on other enabling machines and devices: Secondary | ICD-10-CM | POA: Diagnosis not present

## 2016-09-25 DIAGNOSIS — H02834 Dermatochalasis of left upper eyelid: Secondary | ICD-10-CM | POA: Diagnosis not present

## 2016-09-25 DIAGNOSIS — H52221 Regular astigmatism, right eye: Secondary | ICD-10-CM | POA: Diagnosis not present

## 2016-09-25 DIAGNOSIS — H25811 Combined forms of age-related cataract, right eye: Secondary | ICD-10-CM | POA: Diagnosis not present

## 2016-09-25 DIAGNOSIS — G4733 Obstructive sleep apnea (adult) (pediatric): Secondary | ICD-10-CM | POA: Diagnosis not present

## 2016-09-25 DIAGNOSIS — H02831 Dermatochalasis of right upper eyelid: Secondary | ICD-10-CM | POA: Diagnosis not present

## 2016-10-01 ENCOUNTER — Ambulatory Visit (INDEPENDENT_AMBULATORY_CARE_PROVIDER_SITE_OTHER): Payer: Medicare Other | Admitting: *Deleted

## 2016-10-01 DIAGNOSIS — I4891 Unspecified atrial fibrillation: Secondary | ICD-10-CM | POA: Diagnosis not present

## 2016-10-01 DIAGNOSIS — Z5181 Encounter for therapeutic drug level monitoring: Secondary | ICD-10-CM | POA: Diagnosis not present

## 2016-10-01 LAB — POCT INR: INR: 1.9

## 2016-10-05 DIAGNOSIS — M1990 Unspecified osteoarthritis, unspecified site: Secondary | ICD-10-CM | POA: Diagnosis not present

## 2016-10-05 DIAGNOSIS — Z0000 Encounter for general adult medical examination without abnormal findings: Secondary | ICD-10-CM | POA: Diagnosis not present

## 2016-10-05 DIAGNOSIS — I119 Hypertensive heart disease without heart failure: Secondary | ICD-10-CM | POA: Diagnosis not present

## 2016-10-05 DIAGNOSIS — R7303 Prediabetes: Secondary | ICD-10-CM | POA: Diagnosis not present

## 2016-10-25 DIAGNOSIS — G4733 Obstructive sleep apnea (adult) (pediatric): Secondary | ICD-10-CM | POA: Diagnosis not present

## 2016-11-27 DIAGNOSIS — H34232 Retinal artery branch occlusion, left eye: Secondary | ICD-10-CM | POA: Diagnosis not present

## 2016-11-27 DIAGNOSIS — H35421 Microcystoid degeneration of retina, right eye: Secondary | ICD-10-CM | POA: Diagnosis not present

## 2016-11-27 DIAGNOSIS — H353221 Exudative age-related macular degeneration, left eye, with active choroidal neovascularization: Secondary | ICD-10-CM | POA: Diagnosis not present

## 2016-11-27 DIAGNOSIS — H353113 Nonexudative age-related macular degeneration, right eye, advanced atrophic without subfoveal involvement: Secondary | ICD-10-CM | POA: Diagnosis not present

## 2017-02-19 DIAGNOSIS — H353221 Exudative age-related macular degeneration, left eye, with active choroidal neovascularization: Secondary | ICD-10-CM | POA: Diagnosis not present

## 2017-02-19 DIAGNOSIS — H353113 Nonexudative age-related macular degeneration, right eye, advanced atrophic without subfoveal involvement: Secondary | ICD-10-CM | POA: Diagnosis not present

## 2017-02-19 DIAGNOSIS — H43813 Vitreous degeneration, bilateral: Secondary | ICD-10-CM | POA: Diagnosis not present

## 2017-02-19 DIAGNOSIS — H34232 Retinal artery branch occlusion, left eye: Secondary | ICD-10-CM | POA: Diagnosis not present

## 2017-04-09 ENCOUNTER — Ambulatory Visit
Admission: RE | Admit: 2017-04-09 | Discharge: 2017-04-09 | Disposition: A | Discharge status: Home / Self Care | Payer: Medicare Other | Source: Ambulatory Visit | Attending: Family Medicine | Admitting: Family Medicine

## 2017-04-09 ENCOUNTER — Other Ambulatory Visit: Payer: Self-pay | Admitting: Family Medicine

## 2017-04-09 DIAGNOSIS — Z1231 Encounter for screening mammogram for malignant neoplasm of breast: Secondary | ICD-10-CM

## 2017-04-10 DIAGNOSIS — I4891 Unspecified atrial fibrillation: Secondary | ICD-10-CM | POA: Diagnosis not present

## 2017-04-10 DIAGNOSIS — I119 Hypertensive heart disease without heart failure: Secondary | ICD-10-CM | POA: Diagnosis not present

## 2017-04-10 DIAGNOSIS — E785 Hyperlipidemia, unspecified: Secondary | ICD-10-CM | POA: Diagnosis not present

## 2017-04-10 DIAGNOSIS — R7303 Prediabetes: Secondary | ICD-10-CM | POA: Diagnosis not present

## 2017-04-18 IMAGING — MG 2D DIGITAL SCREENING BILATERAL MAMMOGRAM WITH CAD AND ADJUNCT TO
8 of 13 series · 8 of 29 positions shown · non-contrast
Comparison: Previous exam(s).

CLINICAL DATA: Screening.

EXAM:
2D DIGITAL SCREENING BILATERAL MAMMOGRAM WITH CAD AND ADJUNCT TOMO

[R CV]
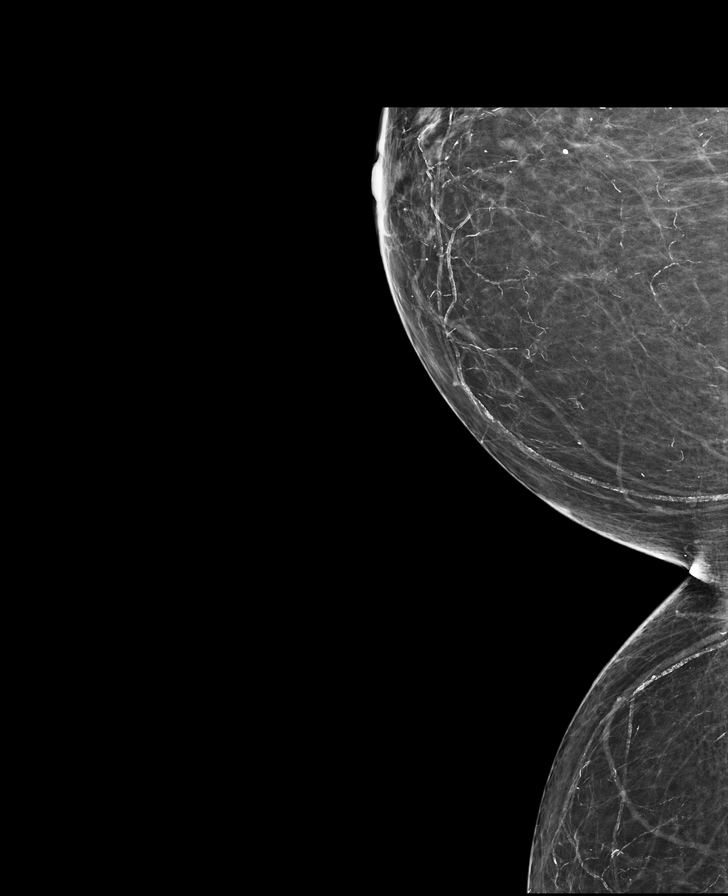

[R MLO synth-2D]
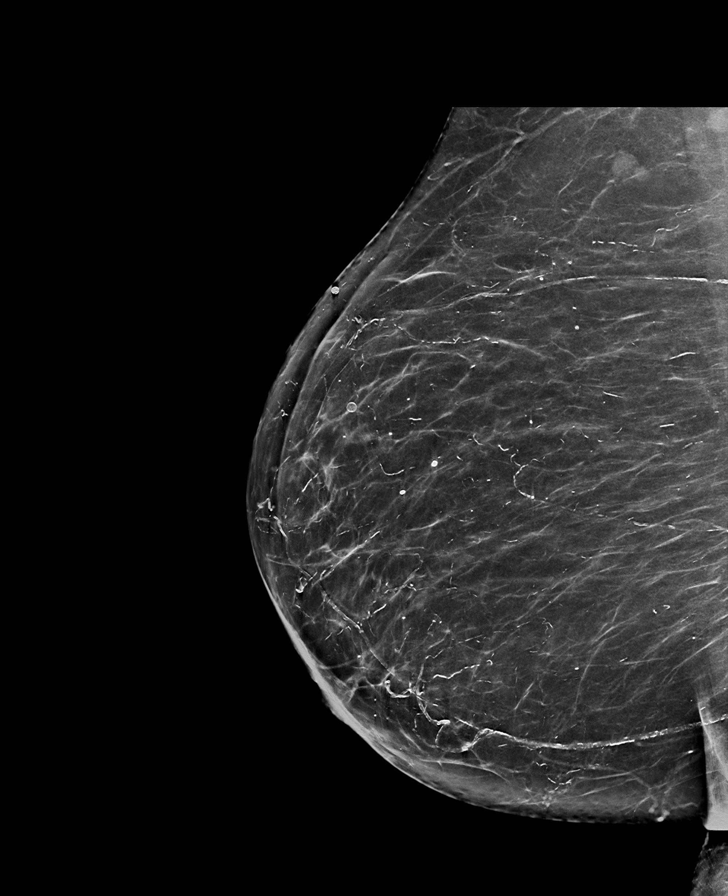

[L CC]
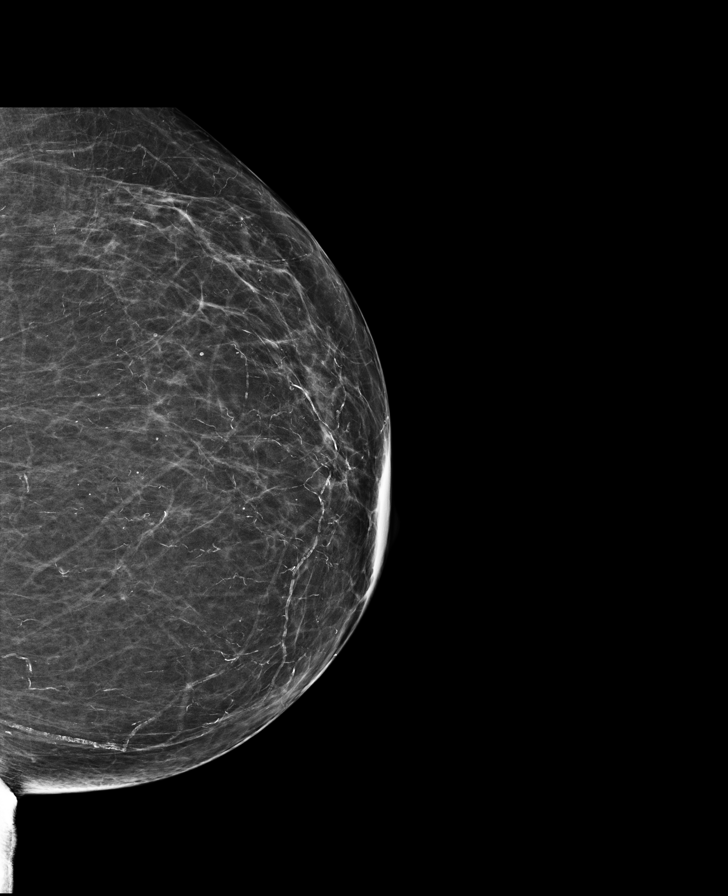

[L MLO synth-2D]
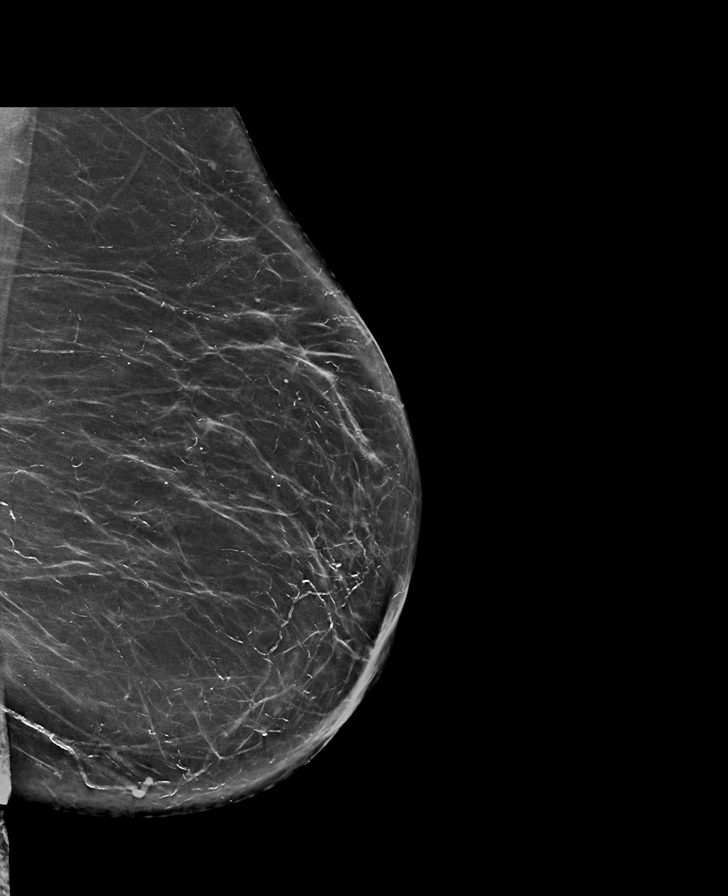

[R MLO]
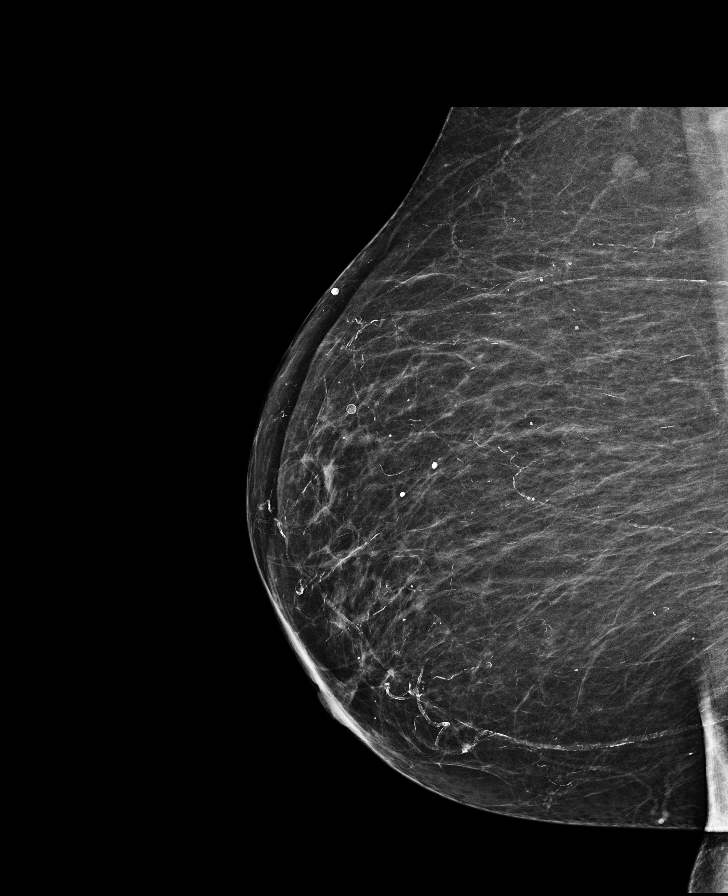

[L CC synth-2D]
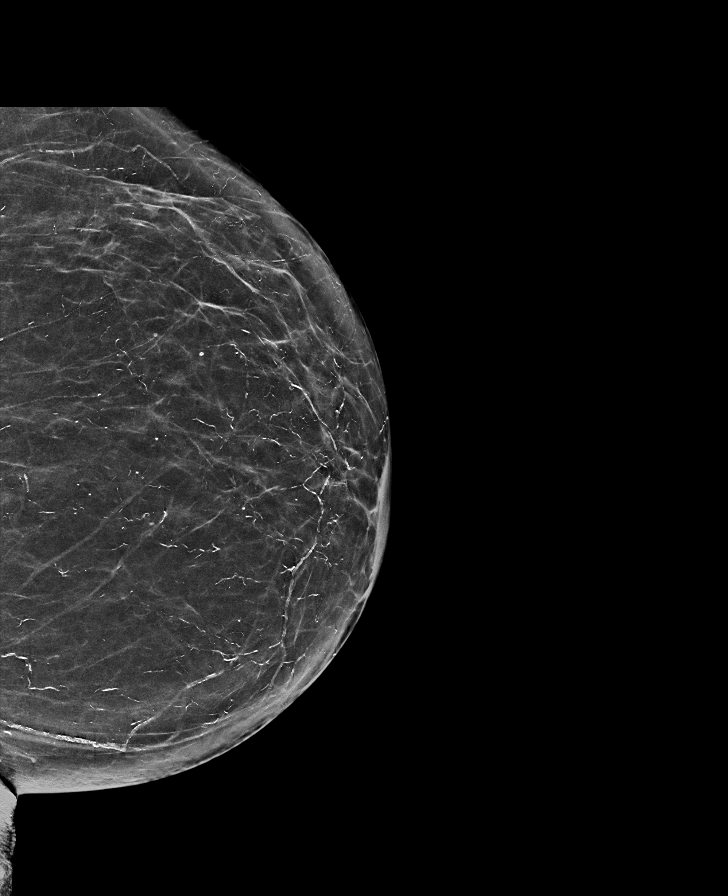

[L MLO]
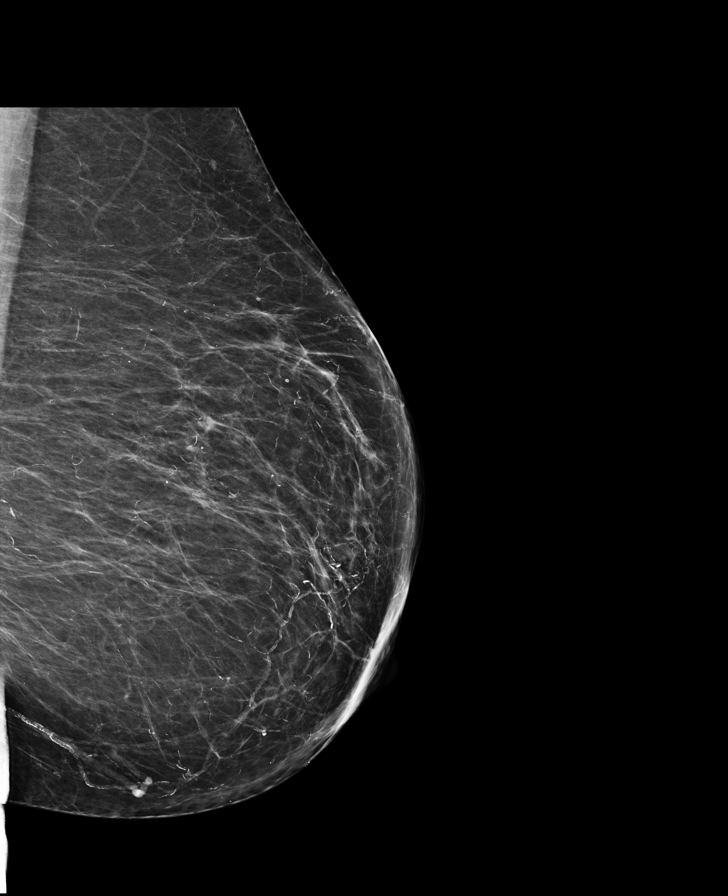

[R CC]
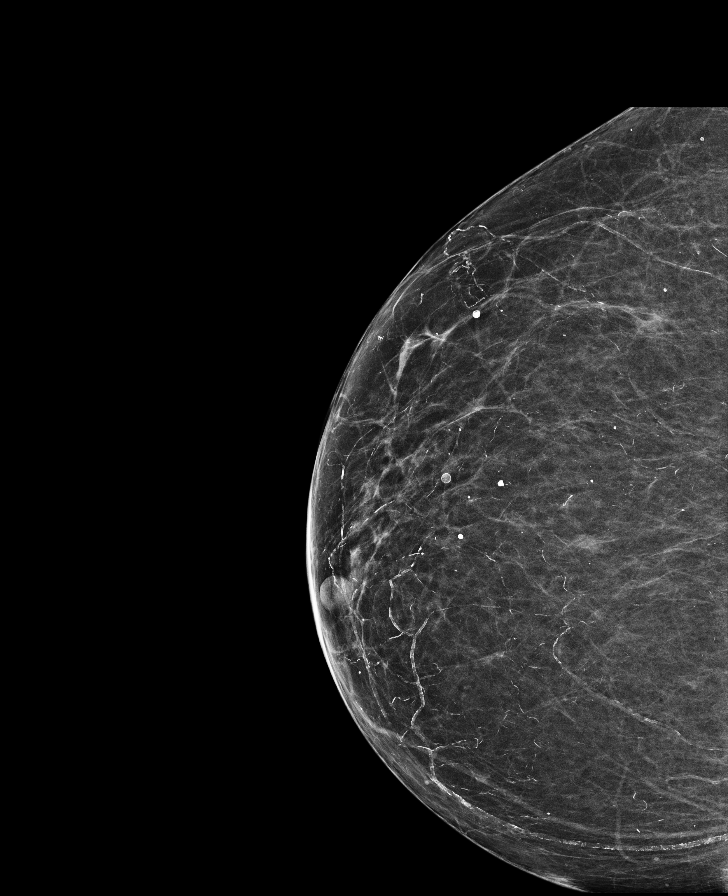

[8 of 29 positions shown; findings below may reference images not displayed]

ACR Breast Density Category b: There are scattered areas of
fibroglandular density.
FINDINGS: There are no findings suspicious for malignancy. Images were
processed with CAD.
IMPRESSION: No mammographic evidence of malignancy. A result letter of this
screening mammogram will be mailed directly to the patient.

RECOMMENDATION:
Screening mammogram in one year. (Code:97-6-RS4)

BI-RADS CATEGORY  1: Negative.

## 2017-05-30 DIAGNOSIS — G4733 Obstructive sleep apnea (adult) (pediatric): Secondary | ICD-10-CM | POA: Diagnosis not present

## 2017-07-02 DIAGNOSIS — H4321 Crystalline deposits in vitreous body, right eye: Secondary | ICD-10-CM | POA: Diagnosis not present

## 2017-07-02 DIAGNOSIS — H43813 Vitreous degeneration, bilateral: Secondary | ICD-10-CM | POA: Diagnosis not present

## 2017-07-02 DIAGNOSIS — H353113 Nonexudative age-related macular degeneration, right eye, advanced atrophic without subfoveal involvement: Secondary | ICD-10-CM | POA: Diagnosis not present

## 2017-07-02 DIAGNOSIS — H353222 Exudative age-related macular degeneration, left eye, with inactive choroidal neovascularization: Secondary | ICD-10-CM | POA: Diagnosis not present

## 2017-08-27 DIAGNOSIS — H353113 Nonexudative age-related macular degeneration, right eye, advanced atrophic without subfoveal involvement: Secondary | ICD-10-CM | POA: Diagnosis not present

## 2017-08-27 DIAGNOSIS — H353222 Exudative age-related macular degeneration, left eye, with inactive choroidal neovascularization: Secondary | ICD-10-CM | POA: Diagnosis not present

## 2017-08-30 DIAGNOSIS — H353113 Nonexudative age-related macular degeneration, right eye, advanced atrophic without subfoveal involvement: Secondary | ICD-10-CM | POA: Diagnosis not present

## 2017-08-30 DIAGNOSIS — H34232 Retinal artery branch occlusion, left eye: Secondary | ICD-10-CM | POA: Diagnosis not present

## 2017-08-30 DIAGNOSIS — H43813 Vitreous degeneration, bilateral: Secondary | ICD-10-CM | POA: Diagnosis not present

## 2017-08-30 DIAGNOSIS — H353221 Exudative age-related macular degeneration, left eye, with active choroidal neovascularization: Secondary | ICD-10-CM | POA: Diagnosis not present

## 2017-09-06 DIAGNOSIS — G4733 Obstructive sleep apnea (adult) (pediatric): Secondary | ICD-10-CM | POA: Diagnosis not present

## 2017-10-16 DIAGNOSIS — E785 Hyperlipidemia, unspecified: Secondary | ICD-10-CM | POA: Diagnosis not present

## 2017-10-16 DIAGNOSIS — I4891 Unspecified atrial fibrillation: Secondary | ICD-10-CM | POA: Diagnosis not present

## 2017-10-16 DIAGNOSIS — Z0000 Encounter for general adult medical examination without abnormal findings: Secondary | ICD-10-CM | POA: Diagnosis not present

## 2017-10-16 DIAGNOSIS — I119 Hypertensive heart disease without heart failure: Secondary | ICD-10-CM | POA: Diagnosis not present

## 2017-10-22 DIAGNOSIS — H353112 Nonexudative age-related macular degeneration, right eye, intermediate dry stage: Secondary | ICD-10-CM | POA: Diagnosis not present

## 2017-10-22 DIAGNOSIS — H353221 Exudative age-related macular degeneration, left eye, with active choroidal neovascularization: Secondary | ICD-10-CM | POA: Diagnosis not present

## 2017-10-22 DIAGNOSIS — H02834 Dermatochalasis of left upper eyelid: Secondary | ICD-10-CM | POA: Diagnosis not present

## 2017-10-22 DIAGNOSIS — E119 Type 2 diabetes mellitus without complications: Secondary | ICD-10-CM | POA: Diagnosis not present

## 2017-10-23 DIAGNOSIS — H353113 Nonexudative age-related macular degeneration, right eye, advanced atrophic without subfoveal involvement: Secondary | ICD-10-CM | POA: Diagnosis not present

## 2017-10-23 DIAGNOSIS — H353221 Exudative age-related macular degeneration, left eye, with active choroidal neovascularization: Secondary | ICD-10-CM | POA: Diagnosis not present

## 2017-10-23 DIAGNOSIS — H4321 Crystalline deposits in vitreous body, right eye: Secondary | ICD-10-CM | POA: Diagnosis not present

## 2017-10-23 DIAGNOSIS — H34232 Retinal artery branch occlusion, left eye: Secondary | ICD-10-CM | POA: Diagnosis not present

## 2017-12-16 DIAGNOSIS — G4733 Obstructive sleep apnea (adult) (pediatric): Secondary | ICD-10-CM | POA: Diagnosis not present

## 2017-12-18 DIAGNOSIS — H353221 Exudative age-related macular degeneration, left eye, with active choroidal neovascularization: Secondary | ICD-10-CM | POA: Diagnosis not present

## 2017-12-18 DIAGNOSIS — H34232 Retinal artery branch occlusion, left eye: Secondary | ICD-10-CM | POA: Diagnosis not present

## 2017-12-18 DIAGNOSIS — H353113 Nonexudative age-related macular degeneration, right eye, advanced atrophic without subfoveal involvement: Secondary | ICD-10-CM | POA: Diagnosis not present

## 2017-12-18 DIAGNOSIS — H43821 Vitreomacular adhesion, right eye: Secondary | ICD-10-CM | POA: Diagnosis not present

## 2018-01-13 DIAGNOSIS — G4733 Obstructive sleep apnea (adult) (pediatric): Secondary | ICD-10-CM | POA: Diagnosis not present

## 2018-02-26 DIAGNOSIS — H353113 Nonexudative age-related macular degeneration, right eye, advanced atrophic without subfoveal involvement: Secondary | ICD-10-CM | POA: Diagnosis not present

## 2018-02-26 DIAGNOSIS — H34212 Partial retinal artery occlusion, left eye: Secondary | ICD-10-CM | POA: Diagnosis not present

## 2018-02-26 DIAGNOSIS — H43821 Vitreomacular adhesion, right eye: Secondary | ICD-10-CM | POA: Diagnosis not present

## 2018-02-26 DIAGNOSIS — H353221 Exudative age-related macular degeneration, left eye, with active choroidal neovascularization: Secondary | ICD-10-CM | POA: Diagnosis not present

## 2018-03-05 DIAGNOSIS — Z23 Encounter for immunization: Secondary | ICD-10-CM | POA: Diagnosis not present

## 2018-04-10 DIAGNOSIS — I119 Hypertensive heart disease without heart failure: Secondary | ICD-10-CM | POA: Diagnosis not present

## 2018-04-10 DIAGNOSIS — E119 Type 2 diabetes mellitus without complications: Secondary | ICD-10-CM | POA: Diagnosis not present

## 2018-04-10 DIAGNOSIS — I4891 Unspecified atrial fibrillation: Secondary | ICD-10-CM | POA: Diagnosis not present

## 2018-04-10 DIAGNOSIS — E785 Hyperlipidemia, unspecified: Secondary | ICD-10-CM | POA: Diagnosis not present

## 2018-04-21 ENCOUNTER — Other Ambulatory Visit (HOSPITAL_BASED_OUTPATIENT_CLINIC_OR_DEPARTMENT_OTHER): Payer: Self-pay | Admitting: Family Medicine

## 2018-04-21 ENCOUNTER — Other Ambulatory Visit: Payer: Self-pay | Admitting: Family Medicine

## 2018-04-21 DIAGNOSIS — Z1231 Encounter for screening mammogram for malignant neoplasm of breast: Secondary | ICD-10-CM

## 2018-04-23 ENCOUNTER — Ambulatory Visit (HOSPITAL_BASED_OUTPATIENT_CLINIC_OR_DEPARTMENT_OTHER)
Admission: RE | Admit: 2018-04-23 | Discharge: 2018-04-23 | Disposition: A | Discharge status: Home / Self Care | Payer: Medicare Other | Source: Ambulatory Visit | Attending: Family Medicine | Admitting: Family Medicine

## 2018-04-23 DIAGNOSIS — Z1231 Encounter for screening mammogram for malignant neoplasm of breast: Secondary | ICD-10-CM | POA: Diagnosis not present

## 2018-06-11 DIAGNOSIS — H353221 Exudative age-related macular degeneration, left eye, with active choroidal neovascularization: Secondary | ICD-10-CM | POA: Diagnosis not present

## 2018-06-11 DIAGNOSIS — H34232 Retinal artery branch occlusion, left eye: Secondary | ICD-10-CM | POA: Diagnosis not present

## 2018-06-11 DIAGNOSIS — H353113 Nonexudative age-related macular degeneration, right eye, advanced atrophic without subfoveal involvement: Secondary | ICD-10-CM | POA: Diagnosis not present

## 2018-06-11 DIAGNOSIS — H35421 Microcystoid degeneration of retina, right eye: Secondary | ICD-10-CM | POA: Diagnosis not present

## 2018-09-10 DIAGNOSIS — H353221 Exudative age-related macular degeneration, left eye, with active choroidal neovascularization: Secondary | ICD-10-CM | POA: Diagnosis not present

## 2018-10-29 DIAGNOSIS — H353221 Exudative age-related macular degeneration, left eye, with active choroidal neovascularization: Secondary | ICD-10-CM | POA: Diagnosis not present

## 2018-10-29 DIAGNOSIS — H353112 Nonexudative age-related macular degeneration, right eye, intermediate dry stage: Secondary | ICD-10-CM | POA: Diagnosis not present

## 2018-10-29 DIAGNOSIS — H26491 Other secondary cataract, right eye: Secondary | ICD-10-CM | POA: Diagnosis not present

## 2018-10-29 DIAGNOSIS — E119 Type 2 diabetes mellitus without complications: Secondary | ICD-10-CM | POA: Diagnosis not present

## 2018-11-25 DIAGNOSIS — E119 Type 2 diabetes mellitus without complications: Secondary | ICD-10-CM | POA: Diagnosis not present

## 2018-11-25 DIAGNOSIS — Z0000 Encounter for general adult medical examination without abnormal findings: Secondary | ICD-10-CM | POA: Diagnosis not present

## 2018-11-25 DIAGNOSIS — E785 Hyperlipidemia, unspecified: Secondary | ICD-10-CM | POA: Diagnosis not present

## 2018-11-25 DIAGNOSIS — I119 Hypertensive heart disease without heart failure: Secondary | ICD-10-CM | POA: Diagnosis not present

## 2018-11-27 DIAGNOSIS — G4733 Obstructive sleep apnea (adult) (pediatric): Secondary | ICD-10-CM | POA: Diagnosis not present

## 2018-12-10 DIAGNOSIS — H43821 Vitreomacular adhesion, right eye: Secondary | ICD-10-CM | POA: Diagnosis not present

## 2018-12-10 DIAGNOSIS — H353221 Exudative age-related macular degeneration, left eye, with active choroidal neovascularization: Secondary | ICD-10-CM | POA: Diagnosis not present

## 2018-12-10 DIAGNOSIS — H34212 Partial retinal artery occlusion, left eye: Secondary | ICD-10-CM | POA: Diagnosis not present

## 2018-12-10 DIAGNOSIS — H353113 Nonexudative age-related macular degeneration, right eye, advanced atrophic without subfoveal involvement: Secondary | ICD-10-CM | POA: Diagnosis not present

## 2019-03-03 DIAGNOSIS — G4733 Obstructive sleep apnea (adult) (pediatric): Secondary | ICD-10-CM | POA: Diagnosis not present

## 2019-03-10 DIAGNOSIS — Z23 Encounter for immunization: Secondary | ICD-10-CM | POA: Diagnosis not present

## 2019-03-25 DIAGNOSIS — H43821 Vitreomacular adhesion, right eye: Secondary | ICD-10-CM | POA: Diagnosis not present

## 2019-03-25 DIAGNOSIS — H353113 Nonexudative age-related macular degeneration, right eye, advanced atrophic without subfoveal involvement: Secondary | ICD-10-CM | POA: Diagnosis not present

## 2019-03-25 DIAGNOSIS — H353221 Exudative age-related macular degeneration, left eye, with active choroidal neovascularization: Secondary | ICD-10-CM | POA: Diagnosis not present

## 2019-03-25 DIAGNOSIS — H34212 Partial retinal artery occlusion, left eye: Secondary | ICD-10-CM | POA: Diagnosis not present

## 2019-04-16 ENCOUNTER — Other Ambulatory Visit (HOSPITAL_BASED_OUTPATIENT_CLINIC_OR_DEPARTMENT_OTHER): Payer: Self-pay | Admitting: Physician Assistant

## 2019-04-16 DIAGNOSIS — Z1231 Encounter for screening mammogram for malignant neoplasm of breast: Secondary | ICD-10-CM

## 2019-04-27 ENCOUNTER — Ambulatory Visit (HOSPITAL_BASED_OUTPATIENT_CLINIC_OR_DEPARTMENT_OTHER): Payer: Medicare Other

## 2019-05-22 DIAGNOSIS — E785 Hyperlipidemia, unspecified: Secondary | ICD-10-CM | POA: Diagnosis not present

## 2019-05-22 DIAGNOSIS — I119 Hypertensive heart disease without heart failure: Secondary | ICD-10-CM | POA: Diagnosis not present

## 2019-05-22 DIAGNOSIS — I4891 Unspecified atrial fibrillation: Secondary | ICD-10-CM | POA: Diagnosis not present

## 2019-05-22 DIAGNOSIS — E119 Type 2 diabetes mellitus without complications: Secondary | ICD-10-CM | POA: Diagnosis not present

## 2019-05-25 DIAGNOSIS — L03032 Cellulitis of left toe: Secondary | ICD-10-CM | POA: Diagnosis not present

## 2019-06-01 DIAGNOSIS — G4733 Obstructive sleep apnea (adult) (pediatric): Secondary | ICD-10-CM | POA: Diagnosis not present

## 2019-06-10 DIAGNOSIS — N898 Other specified noninflammatory disorders of vagina: Secondary | ICD-10-CM | POA: Diagnosis not present

## 2019-06-17 DIAGNOSIS — L309 Dermatitis, unspecified: Secondary | ICD-10-CM | POA: Diagnosis not present

## 2019-07-22 DIAGNOSIS — H353222 Exudative age-related macular degeneration, left eye, with inactive choroidal neovascularization: Secondary | ICD-10-CM | POA: Diagnosis not present

## 2019-07-22 DIAGNOSIS — H353113 Nonexudative age-related macular degeneration, right eye, advanced atrophic without subfoveal involvement: Secondary | ICD-10-CM | POA: Diagnosis not present

## 2019-07-22 DIAGNOSIS — H43821 Vitreomacular adhesion, right eye: Secondary | ICD-10-CM | POA: Diagnosis not present

## 2019-07-22 DIAGNOSIS — H34232 Retinal artery branch occlusion, left eye: Secondary | ICD-10-CM | POA: Diagnosis not present

## 2019-10-21 DIAGNOSIS — H353222 Exudative age-related macular degeneration, left eye, with inactive choroidal neovascularization: Secondary | ICD-10-CM | POA: Diagnosis not present

## 2019-10-21 DIAGNOSIS — H353113 Nonexudative age-related macular degeneration, right eye, advanced atrophic without subfoveal involvement: Secondary | ICD-10-CM | POA: Diagnosis not present

## 2019-11-25 DIAGNOSIS — H4321 Crystalline deposits in vitreous body, right eye: Secondary | ICD-10-CM | POA: Diagnosis not present

## 2019-11-25 DIAGNOSIS — H353222 Exudative age-related macular degeneration, left eye, with inactive choroidal neovascularization: Secondary | ICD-10-CM | POA: Diagnosis not present

## 2019-11-25 DIAGNOSIS — H353113 Nonexudative age-related macular degeneration, right eye, advanced atrophic without subfoveal involvement: Secondary | ICD-10-CM | POA: Diagnosis not present

## 2019-11-25 DIAGNOSIS — H43813 Vitreous degeneration, bilateral: Secondary | ICD-10-CM | POA: Diagnosis not present

## 2019-11-27 DIAGNOSIS — I10 Essential (primary) hypertension: Secondary | ICD-10-CM | POA: Diagnosis not present

## 2019-11-27 DIAGNOSIS — Z0000 Encounter for general adult medical examination without abnormal findings: Secondary | ICD-10-CM | POA: Diagnosis not present

## 2019-11-27 DIAGNOSIS — E119 Type 2 diabetes mellitus without complications: Secondary | ICD-10-CM | POA: Diagnosis not present

## 2019-11-27 DIAGNOSIS — E785 Hyperlipidemia, unspecified: Secondary | ICD-10-CM | POA: Diagnosis not present

## 2020-02-23 DIAGNOSIS — H353222 Exudative age-related macular degeneration, left eye, with inactive choroidal neovascularization: Secondary | ICD-10-CM | POA: Diagnosis not present

## 2020-03-22 DIAGNOSIS — Z23 Encounter for immunization: Secondary | ICD-10-CM | POA: Diagnosis not present

## 2020-05-17 DIAGNOSIS — G4733 Obstructive sleep apnea (adult) (pediatric): Secondary | ICD-10-CM | POA: Diagnosis not present

## 2020-06-24 ENCOUNTER — Ambulatory Visit
Admission: RE | Admit: 2020-06-24 | Discharge: 2020-06-24 | Disposition: A | Discharge status: Home / Self Care | Payer: HMO | Source: Ambulatory Visit | Attending: Physician Assistant | Admitting: Physician Assistant

## 2020-06-24 ENCOUNTER — Other Ambulatory Visit: Payer: Self-pay | Admitting: Physician Assistant

## 2020-06-24 ENCOUNTER — Other Ambulatory Visit: Payer: Self-pay

## 2020-06-24 DIAGNOSIS — Z1231 Encounter for screening mammogram for malignant neoplasm of breast: Secondary | ICD-10-CM

## 2021-08-16 DIAGNOSIS — E119 Type 2 diabetes mellitus without complications: Secondary | ICD-10-CM | POA: Diagnosis not present

## 2021-08-16 DIAGNOSIS — H34232 Retinal artery branch occlusion, left eye: Secondary | ICD-10-CM | POA: Diagnosis not present

## 2021-08-16 DIAGNOSIS — H353113 Nonexudative age-related macular degeneration, right eye, advanced atrophic without subfoveal involvement: Secondary | ICD-10-CM | POA: Diagnosis not present

## 2021-08-16 DIAGNOSIS — H353221 Exudative age-related macular degeneration, left eye, with active choroidal neovascularization: Secondary | ICD-10-CM | POA: Diagnosis not present

## 2021-10-16 DIAGNOSIS — G4733 Obstructive sleep apnea (adult) (pediatric): Secondary | ICD-10-CM | POA: Diagnosis not present

## 2021-12-06 DIAGNOSIS — I119 Hypertensive heart disease without heart failure: Secondary | ICD-10-CM | POA: Diagnosis not present

## 2021-12-06 DIAGNOSIS — D6869 Other thrombophilia: Secondary | ICD-10-CM | POA: Diagnosis not present

## 2021-12-06 DIAGNOSIS — E785 Hyperlipidemia, unspecified: Secondary | ICD-10-CM | POA: Diagnosis not present

## 2021-12-06 DIAGNOSIS — Z0000 Encounter for general adult medical examination without abnormal findings: Secondary | ICD-10-CM | POA: Diagnosis not present

## 2021-12-06 DIAGNOSIS — E1169 Type 2 diabetes mellitus with other specified complication: Secondary | ICD-10-CM | POA: Diagnosis not present

## 2021-12-06 DIAGNOSIS — I10 Essential (primary) hypertension: Secondary | ICD-10-CM | POA: Diagnosis not present

## 2021-12-06 DIAGNOSIS — I4891 Unspecified atrial fibrillation: Secondary | ICD-10-CM | POA: Diagnosis not present

## 2021-12-06 DIAGNOSIS — M1990 Unspecified osteoarthritis, unspecified site: Secondary | ICD-10-CM | POA: Diagnosis not present

## 2021-12-22 DIAGNOSIS — H353113 Nonexudative age-related macular degeneration, right eye, advanced atrophic without subfoveal involvement: Secondary | ICD-10-CM | POA: Diagnosis not present

## 2021-12-22 DIAGNOSIS — H353222 Exudative age-related macular degeneration, left eye, with inactive choroidal neovascularization: Secondary | ICD-10-CM | POA: Diagnosis not present

## 2021-12-22 DIAGNOSIS — H43813 Vitreous degeneration, bilateral: Secondary | ICD-10-CM | POA: Diagnosis not present

## 2021-12-22 DIAGNOSIS — H4321 Crystalline deposits in vitreous body, right eye: Secondary | ICD-10-CM | POA: Diagnosis not present

## 2022-01-23 DIAGNOSIS — E1169 Type 2 diabetes mellitus with other specified complication: Secondary | ICD-10-CM | POA: Diagnosis not present

## 2022-01-23 DIAGNOSIS — M1990 Unspecified osteoarthritis, unspecified site: Secondary | ICD-10-CM | POA: Diagnosis not present

## 2022-01-23 DIAGNOSIS — E785 Hyperlipidemia, unspecified: Secondary | ICD-10-CM | POA: Diagnosis not present

## 2022-01-23 DIAGNOSIS — I10 Essential (primary) hypertension: Secondary | ICD-10-CM | POA: Diagnosis not present

## 2022-01-23 DIAGNOSIS — I4891 Unspecified atrial fibrillation: Secondary | ICD-10-CM | POA: Diagnosis not present

## 2022-02-14 DIAGNOSIS — H353222 Exudative age-related macular degeneration, left eye, with inactive choroidal neovascularization: Secondary | ICD-10-CM | POA: Diagnosis not present

## 2022-04-18 DIAGNOSIS — H353113 Nonexudative age-related macular degeneration, right eye, advanced atrophic without subfoveal involvement: Secondary | ICD-10-CM | POA: Diagnosis not present

## 2022-04-18 DIAGNOSIS — H34232 Retinal artery branch occlusion, left eye: Secondary | ICD-10-CM | POA: Diagnosis not present

## 2022-04-18 DIAGNOSIS — E119 Type 2 diabetes mellitus without complications: Secondary | ICD-10-CM | POA: Diagnosis not present

## 2022-04-18 DIAGNOSIS — H353222 Exudative age-related macular degeneration, left eye, with inactive choroidal neovascularization: Secondary | ICD-10-CM | POA: Diagnosis not present

## 2022-06-08 ENCOUNTER — Other Ambulatory Visit (HOSPITAL_COMMUNITY): Payer: Self-pay

## 2022-06-08 DIAGNOSIS — I10 Essential (primary) hypertension: Secondary | ICD-10-CM | POA: Diagnosis not present

## 2022-06-08 DIAGNOSIS — H3530 Unspecified macular degeneration: Secondary | ICD-10-CM | POA: Diagnosis not present

## 2022-06-08 DIAGNOSIS — E785 Hyperlipidemia, unspecified: Secondary | ICD-10-CM | POA: Diagnosis not present

## 2022-06-08 DIAGNOSIS — D6869 Other thrombophilia: Secondary | ICD-10-CM | POA: Diagnosis not present

## 2022-06-08 DIAGNOSIS — I4891 Unspecified atrial fibrillation: Secondary | ICD-10-CM | POA: Diagnosis not present

## 2022-06-08 DIAGNOSIS — I119 Hypertensive heart disease without heart failure: Secondary | ICD-10-CM | POA: Diagnosis not present

## 2022-06-08 DIAGNOSIS — M1990 Unspecified osteoarthritis, unspecified site: Secondary | ICD-10-CM | POA: Diagnosis not present

## 2022-06-08 DIAGNOSIS — E1169 Type 2 diabetes mellitus with other specified complication: Secondary | ICD-10-CM | POA: Diagnosis not present

## 2022-06-08 MED ORDER — DILTIAZEM HCL ER COATED BEADS 120 MG PO CP24
120.0000 mg | ORAL_CAPSULE | Freq: Every day | ORAL | 3 refills | Status: AC
  Filled 2022-06-08 – 2022-06-12 (×4): qty 90, 90d supply, fill #0
  Filled 2022-09-06 (×2): qty 90, 90d supply, fill #1
  Filled 2022-11-19: qty 90, 90d supply, fill #2
  Filled 2023-02-22: qty 90, 90d supply, fill #3

## 2022-06-08 MED ORDER — LOSARTAN POTASSIUM-HCTZ 100-12.5 MG PO TABS
1.0000 | ORAL_TABLET | Freq: Every day | ORAL | 3 refills | Status: AC
  Filled 2022-06-08 – 2022-06-12 (×4): qty 90, 90d supply, fill #0
  Filled 2022-09-06 (×2): qty 90, 90d supply, fill #1
  Filled 2022-11-19: qty 90, 90d supply, fill #2
  Filled 2023-02-22: qty 90, 90d supply, fill #3

## 2022-06-08 MED ORDER — FUROSEMIDE 40 MG PO TABS
40.0000 mg | ORAL_TABLET | Freq: Every day | ORAL | 3 refills | Status: AC
  Filled 2022-06-08 – 2022-06-12 (×4): qty 90, 90d supply, fill #0
  Filled 2022-09-06 (×2): qty 90, 90d supply, fill #1
  Filled 2022-11-19: qty 90, 90d supply, fill #2
  Filled 2023-02-22: qty 90, 90d supply, fill #3

## 2022-06-08 MED ORDER — ROSUVASTATIN CALCIUM 10 MG PO TABS
10.0000 mg | ORAL_TABLET | ORAL | 3 refills | Status: AC
  Filled 2022-06-08 – 2022-06-12 (×4): qty 12, 84d supply, fill #0
  Filled 2022-09-06 (×3): qty 12, 84d supply, fill #1
  Filled 2022-11-19: qty 12, 84d supply, fill #2

## 2022-06-08 MED ORDER — METFORMIN HCL ER 500 MG PO TB24
1000.0000 mg | ORAL_TABLET | Freq: Two times a day (BID) | ORAL | 3 refills | Status: AC
  Filled 2022-06-08 – 2022-08-01 (×4): qty 360, 90d supply, fill #0
  Filled 2022-11-19: qty 360, 90d supply, fill #1
  Filled 2023-02-22: qty 360, 90d supply, fill #2

## 2022-06-08 MED ORDER — TRAMADOL HCL 50 MG PO TABS
50.0000 mg | ORAL_TABLET | Freq: Four times a day (QID) | ORAL | 2 refills | Status: DC | PRN
  Filled 2022-06-08 – 2022-09-11 (×2): qty 120, 30d supply, fill #0
  Filled 2022-11-19: qty 120, 30d supply, fill #1

## 2022-06-08 MED ORDER — XARELTO 20 MG PO TABS
20.0000 mg | ORAL_TABLET | Freq: Every day | ORAL | 3 refills | Status: AC
  Filled 2022-06-08 – 2022-06-18 (×4): qty 90, 90d supply, fill #0
  Filled 2022-09-06 (×2): qty 90, 90d supply, fill #1
  Filled 2022-11-19 – 2022-11-23 (×2): qty 30, 30d supply, fill #2
  Filled 2023-01-22: qty 30, 30d supply, fill #3

## 2022-06-08 MED ORDER — METOPROLOL TARTRATE 25 MG PO TABS
25.0000 mg | ORAL_TABLET | Freq: Two times a day (BID) | ORAL | 3 refills | Status: AC
  Filled 2022-06-08 – 2022-06-12 (×4): qty 180, 90d supply, fill #0
  Filled 2022-09-06 (×2): qty 180, 90d supply, fill #1
  Filled 2022-11-19: qty 180, 90d supply, fill #2
  Filled 2023-02-22: qty 180, 90d supply, fill #3

## 2022-06-09 ENCOUNTER — Other Ambulatory Visit (HOSPITAL_COMMUNITY): Payer: Self-pay

## 2022-06-11 ENCOUNTER — Other Ambulatory Visit: Payer: Self-pay

## 2022-06-12 ENCOUNTER — Other Ambulatory Visit: Payer: Self-pay

## 2022-06-12 ENCOUNTER — Other Ambulatory Visit (HOSPITAL_COMMUNITY): Payer: Self-pay

## 2022-06-18 ENCOUNTER — Other Ambulatory Visit (HOSPITAL_COMMUNITY): Payer: Self-pay

## 2022-06-18 ENCOUNTER — Other Ambulatory Visit: Payer: Self-pay

## 2022-07-03 DIAGNOSIS — G4733 Obstructive sleep apnea (adult) (pediatric): Secondary | ICD-10-CM | POA: Diagnosis not present

## 2022-07-11 DIAGNOSIS — Z7984 Long term (current) use of oral hypoglycemic drugs: Secondary | ICD-10-CM | POA: Diagnosis not present

## 2022-07-11 DIAGNOSIS — Z8679 Personal history of other diseases of the circulatory system: Secondary | ICD-10-CM | POA: Diagnosis not present

## 2022-07-11 DIAGNOSIS — Z7901 Long term (current) use of anticoagulants: Secondary | ICD-10-CM | POA: Diagnosis not present

## 2022-07-11 DIAGNOSIS — R040 Epistaxis: Secondary | ICD-10-CM | POA: Diagnosis not present

## 2022-07-11 DIAGNOSIS — I10 Essential (primary) hypertension: Secondary | ICD-10-CM | POA: Diagnosis not present

## 2022-07-11 DIAGNOSIS — Z880 Allergy status to penicillin: Secondary | ICD-10-CM | POA: Diagnosis not present

## 2022-07-11 DIAGNOSIS — G4733 Obstructive sleep apnea (adult) (pediatric): Secondary | ICD-10-CM | POA: Diagnosis not present

## 2022-07-11 DIAGNOSIS — Z79899 Other long term (current) drug therapy: Secondary | ICD-10-CM | POA: Diagnosis not present

## 2022-07-16 DIAGNOSIS — R040 Epistaxis: Secondary | ICD-10-CM | POA: Diagnosis not present

## 2022-07-16 DIAGNOSIS — Z7901 Long term (current) use of anticoagulants: Secondary | ICD-10-CM | POA: Diagnosis not present

## 2022-07-19 DIAGNOSIS — E1169 Type 2 diabetes mellitus with other specified complication: Secondary | ICD-10-CM | POA: Diagnosis not present

## 2022-07-19 DIAGNOSIS — E559 Vitamin D deficiency, unspecified: Secondary | ICD-10-CM | POA: Diagnosis not present

## 2022-07-19 DIAGNOSIS — I110 Hypertensive heart disease with heart failure: Secondary | ICD-10-CM | POA: Diagnosis not present

## 2022-07-19 DIAGNOSIS — I4891 Unspecified atrial fibrillation: Secondary | ICD-10-CM | POA: Diagnosis not present

## 2022-07-19 DIAGNOSIS — E261 Secondary hyperaldosteronism: Secondary | ICD-10-CM | POA: Diagnosis not present

## 2022-07-19 DIAGNOSIS — H353230 Exudative age-related macular degeneration, bilateral, stage unspecified: Secondary | ICD-10-CM | POA: Diagnosis not present

## 2022-07-19 DIAGNOSIS — I509 Heart failure, unspecified: Secondary | ICD-10-CM | POA: Diagnosis not present

## 2022-07-19 DIAGNOSIS — E663 Overweight: Secondary | ICD-10-CM | POA: Diagnosis not present

## 2022-07-19 DIAGNOSIS — E785 Hyperlipidemia, unspecified: Secondary | ICD-10-CM | POA: Diagnosis not present

## 2022-07-19 DIAGNOSIS — D6869 Other thrombophilia: Secondary | ICD-10-CM | POA: Diagnosis not present

## 2022-07-19 DIAGNOSIS — G8929 Other chronic pain: Secondary | ICD-10-CM | POA: Diagnosis not present

## 2022-07-19 DIAGNOSIS — G4733 Obstructive sleep apnea (adult) (pediatric): Secondary | ICD-10-CM | POA: Diagnosis not present

## 2022-07-26 ENCOUNTER — Other Ambulatory Visit (HOSPITAL_COMMUNITY): Payer: Self-pay

## 2022-08-01 ENCOUNTER — Other Ambulatory Visit (HOSPITAL_COMMUNITY): Payer: Self-pay

## 2022-08-24 DIAGNOSIS — H353222 Exudative age-related macular degeneration, left eye, with inactive choroidal neovascularization: Secondary | ICD-10-CM | POA: Diagnosis not present

## 2022-09-06 ENCOUNTER — Other Ambulatory Visit: Payer: Self-pay

## 2022-09-10 DIAGNOSIS — H353112 Nonexudative age-related macular degeneration, right eye, intermediate dry stage: Secondary | ICD-10-CM | POA: Diagnosis not present

## 2022-09-10 DIAGNOSIS — H353221 Exudative age-related macular degeneration, left eye, with active choroidal neovascularization: Secondary | ICD-10-CM | POA: Diagnosis not present

## 2022-09-10 DIAGNOSIS — H526 Other disorders of refraction: Secondary | ICD-10-CM | POA: Diagnosis not present

## 2022-09-10 DIAGNOSIS — E119 Type 2 diabetes mellitus without complications: Secondary | ICD-10-CM | POA: Diagnosis not present

## 2022-09-10 DIAGNOSIS — H26491 Other secondary cataract, right eye: Secondary | ICD-10-CM | POA: Diagnosis not present

## 2022-09-11 ENCOUNTER — Other Ambulatory Visit: Payer: Self-pay

## 2022-09-11 ENCOUNTER — Other Ambulatory Visit (HOSPITAL_COMMUNITY): Payer: Self-pay

## 2022-10-17 DIAGNOSIS — H34232 Retinal artery branch occlusion, left eye: Secondary | ICD-10-CM | POA: Diagnosis not present

## 2022-10-17 DIAGNOSIS — H353221 Exudative age-related macular degeneration, left eye, with active choroidal neovascularization: Secondary | ICD-10-CM | POA: Diagnosis not present

## 2022-10-17 DIAGNOSIS — H353114 Nonexudative age-related macular degeneration, right eye, advanced atrophic with subfoveal involvement: Secondary | ICD-10-CM | POA: Diagnosis not present

## 2022-10-17 DIAGNOSIS — H43813 Vitreous degeneration, bilateral: Secondary | ICD-10-CM | POA: Diagnosis not present

## 2022-10-17 DIAGNOSIS — H43823 Vitreomacular adhesion, bilateral: Secondary | ICD-10-CM | POA: Diagnosis not present

## 2022-10-17 DIAGNOSIS — H35371 Puckering of macula, right eye: Secondary | ICD-10-CM | POA: Diagnosis not present

## 2022-10-24 DIAGNOSIS — H353221 Exudative age-related macular degeneration, left eye, with active choroidal neovascularization: Secondary | ICD-10-CM | POA: Diagnosis not present

## 2022-11-02 ENCOUNTER — Other Ambulatory Visit (HOSPITAL_BASED_OUTPATIENT_CLINIC_OR_DEPARTMENT_OTHER): Payer: Self-pay | Admitting: Family Medicine

## 2022-11-02 DIAGNOSIS — Z1231 Encounter for screening mammogram for malignant neoplasm of breast: Secondary | ICD-10-CM

## 2022-11-06 ENCOUNTER — Ambulatory Visit (HOSPITAL_BASED_OUTPATIENT_CLINIC_OR_DEPARTMENT_OTHER)
Admission: RE | Admit: 2022-11-06 | Discharge: 2022-11-06 | Disposition: A | Discharge status: Home / Self Care | Payer: HMO | Source: Ambulatory Visit | Attending: Family Medicine | Admitting: Family Medicine

## 2022-11-06 ENCOUNTER — Encounter (HOSPITAL_BASED_OUTPATIENT_CLINIC_OR_DEPARTMENT_OTHER): Payer: Self-pay

## 2022-11-06 DIAGNOSIS — Z1231 Encounter for screening mammogram for malignant neoplasm of breast: Secondary | ICD-10-CM | POA: Diagnosis not present

## 2022-11-19 ENCOUNTER — Other Ambulatory Visit: Payer: Self-pay

## 2022-11-19 ENCOUNTER — Other Ambulatory Visit (HOSPITAL_COMMUNITY): Payer: Self-pay

## 2022-11-23 ENCOUNTER — Other Ambulatory Visit (HOSPITAL_COMMUNITY): Payer: Self-pay

## 2022-11-23 ENCOUNTER — Other Ambulatory Visit: Payer: Self-pay

## 2022-11-26 ENCOUNTER — Other Ambulatory Visit: Payer: Self-pay

## 2022-12-11 ENCOUNTER — Other Ambulatory Visit (HOSPITAL_COMMUNITY): Payer: Self-pay

## 2022-12-11 DIAGNOSIS — I10 Essential (primary) hypertension: Secondary | ICD-10-CM | POA: Diagnosis not present

## 2022-12-11 DIAGNOSIS — I4891 Unspecified atrial fibrillation: Secondary | ICD-10-CM | POA: Diagnosis not present

## 2022-12-11 DIAGNOSIS — I119 Hypertensive heart disease without heart failure: Secondary | ICD-10-CM | POA: Diagnosis not present

## 2022-12-11 DIAGNOSIS — Z0000 Encounter for general adult medical examination without abnormal findings: Secondary | ICD-10-CM | POA: Diagnosis not present

## 2022-12-11 DIAGNOSIS — M1990 Unspecified osteoarthritis, unspecified site: Secondary | ICD-10-CM | POA: Diagnosis not present

## 2022-12-11 DIAGNOSIS — E785 Hyperlipidemia, unspecified: Secondary | ICD-10-CM | POA: Diagnosis not present

## 2022-12-11 DIAGNOSIS — E1169 Type 2 diabetes mellitus with other specified complication: Secondary | ICD-10-CM | POA: Diagnosis not present

## 2022-12-11 DIAGNOSIS — H353231 Exudative age-related macular degeneration, bilateral, with active choroidal neovascularization: Secondary | ICD-10-CM | POA: Diagnosis not present

## 2022-12-11 DIAGNOSIS — D6869 Other thrombophilia: Secondary | ICD-10-CM | POA: Diagnosis not present

## 2022-12-11 DIAGNOSIS — E1165 Type 2 diabetes mellitus with hyperglycemia: Secondary | ICD-10-CM | POA: Diagnosis not present

## 2022-12-11 MED ORDER — ROSUVASTATIN CALCIUM 10 MG PO TABS
10.0000 mg | ORAL_TABLET | ORAL | 3 refills | Status: AC
  Filled 2023-02-22: qty 13, 91d supply, fill #0
  Filled 2023-06-27: qty 13, 91d supply, fill #1

## 2022-12-11 MED ORDER — XARELTO 20 MG PO TABS
20.0000 mg | ORAL_TABLET | Freq: Every day | ORAL | 11 refills | Status: DC
  Filled 2023-02-22: qty 30, 30d supply, fill #0
  Filled 2023-03-27: qty 30, 30d supply, fill #1
  Filled 2023-04-27: qty 30, 30d supply, fill #2
  Filled 2023-05-31: qty 30, 30d supply, fill #3
  Filled 2023-06-27: qty 30, 30d supply, fill #4
  Filled 2023-08-02: qty 90, 90d supply, fill #5
  Filled 2023-11-06: qty 90, 90d supply, fill #6

## 2022-12-11 MED ORDER — METOPROLOL TARTRATE 25 MG PO TABS
25.0000 mg | ORAL_TABLET | Freq: Two times a day (BID) | ORAL | 2 refills | Status: AC
  Filled 2023-06-27: qty 180, 90d supply, fill #0
  Filled 2023-09-09: qty 180, 90d supply, fill #1

## 2022-12-11 MED ORDER — LOSARTAN POTASSIUM-HCTZ 100-12.5 MG PO TABS
1.0000 | ORAL_TABLET | Freq: Every day | ORAL | 3 refills | Status: DC
  Filled 2023-06-27: qty 90, 90d supply, fill #0
  Filled 2023-09-09: qty 90, 90d supply, fill #1
  Filled 2023-12-05: qty 90, 90d supply, fill #2

## 2022-12-11 MED ORDER — FUROSEMIDE 40 MG PO TABS
40.0000 mg | ORAL_TABLET | Freq: Every day | ORAL | 3 refills | Status: AC
  Filled 2023-06-27: qty 90, 90d supply, fill #0
  Filled 2023-09-09: qty 90, 90d supply, fill #1

## 2022-12-11 MED ORDER — TRAMADOL HCL 50 MG PO TABS
50.0000 mg | ORAL_TABLET | Freq: Four times a day (QID) | ORAL | 3 refills | Status: AC | PRN
  Filled 2023-02-22: qty 120, 30d supply, fill #0
  Filled 2023-04-27: qty 120, 30d supply, fill #1

## 2022-12-11 MED ORDER — DILTIAZEM HCL ER COATED BEADS 120 MG PO CP24
120.0000 mg | ORAL_CAPSULE | Freq: Every day | ORAL | 3 refills | Status: DC
  Filled 2023-06-27: qty 90, 90d supply, fill #0
  Filled 2023-12-05: qty 90, 90d supply, fill #1

## 2022-12-13 ENCOUNTER — Other Ambulatory Visit (HOSPITAL_COMMUNITY): Payer: Self-pay

## 2022-12-13 MED ORDER — METFORMIN HCL ER 500 MG PO TB24
1000.0000 mg | ORAL_TABLET | Freq: Two times a day (BID) | ORAL | 3 refills | Status: AC
  Filled 2022-12-13: qty 360, 90d supply, fill #0

## 2022-12-18 DIAGNOSIS — H353221 Exudative age-related macular degeneration, left eye, with active choroidal neovascularization: Secondary | ICD-10-CM | POA: Diagnosis not present

## 2022-12-18 DIAGNOSIS — H43813 Vitreous degeneration, bilateral: Secondary | ICD-10-CM | POA: Diagnosis not present

## 2022-12-18 DIAGNOSIS — H35371 Puckering of macula, right eye: Secondary | ICD-10-CM | POA: Diagnosis not present

## 2022-12-18 DIAGNOSIS — H353114 Nonexudative age-related macular degeneration, right eye, advanced atrophic with subfoveal involvement: Secondary | ICD-10-CM | POA: Diagnosis not present

## 2022-12-18 DIAGNOSIS — H43823 Vitreomacular adhesion, bilateral: Secondary | ICD-10-CM | POA: Diagnosis not present

## 2022-12-18 DIAGNOSIS — H34232 Retinal artery branch occlusion, left eye: Secondary | ICD-10-CM | POA: Diagnosis not present

## 2023-01-22 ENCOUNTER — Other Ambulatory Visit: Payer: Self-pay

## 2023-01-22 ENCOUNTER — Other Ambulatory Visit (HOSPITAL_COMMUNITY): Payer: Self-pay

## 2023-01-23 ENCOUNTER — Other Ambulatory Visit (HOSPITAL_COMMUNITY): Payer: Self-pay

## 2023-02-15 DIAGNOSIS — H34232 Retinal artery branch occlusion, left eye: Secondary | ICD-10-CM | POA: Diagnosis not present

## 2023-02-15 DIAGNOSIS — H353221 Exudative age-related macular degeneration, left eye, with active choroidal neovascularization: Secondary | ICD-10-CM | POA: Diagnosis not present

## 2023-02-15 DIAGNOSIS — H43823 Vitreomacular adhesion, bilateral: Secondary | ICD-10-CM | POA: Diagnosis not present

## 2023-02-15 DIAGNOSIS — H353114 Nonexudative age-related macular degeneration, right eye, advanced atrophic with subfoveal involvement: Secondary | ICD-10-CM | POA: Diagnosis not present

## 2023-02-15 DIAGNOSIS — H43813 Vitreous degeneration, bilateral: Secondary | ICD-10-CM | POA: Diagnosis not present

## 2023-02-15 DIAGNOSIS — H35371 Puckering of macula, right eye: Secondary | ICD-10-CM | POA: Diagnosis not present

## 2023-02-22 ENCOUNTER — Other Ambulatory Visit (HOSPITAL_COMMUNITY): Payer: Self-pay

## 2023-02-22 ENCOUNTER — Other Ambulatory Visit: Payer: Self-pay

## 2023-02-25 DIAGNOSIS — L0390 Cellulitis, unspecified: Secondary | ICD-10-CM | POA: Diagnosis not present

## 2023-02-25 DIAGNOSIS — I872 Venous insufficiency (chronic) (peripheral): Secondary | ICD-10-CM | POA: Diagnosis not present

## 2023-02-25 DIAGNOSIS — I4891 Unspecified atrial fibrillation: Secondary | ICD-10-CM | POA: Diagnosis not present

## 2023-02-28 DIAGNOSIS — I872 Venous insufficiency (chronic) (peripheral): Secondary | ICD-10-CM | POA: Diagnosis not present

## 2023-02-28 DIAGNOSIS — Z09 Encounter for follow-up examination after completed treatment for conditions other than malignant neoplasm: Secondary | ICD-10-CM | POA: Diagnosis not present

## 2023-02-28 DIAGNOSIS — L03116 Cellulitis of left lower limb: Secondary | ICD-10-CM | POA: Diagnosis not present

## 2023-03-27 ENCOUNTER — Other Ambulatory Visit (HOSPITAL_COMMUNITY): Payer: Self-pay

## 2023-04-24 DIAGNOSIS — H43823 Vitreomacular adhesion, bilateral: Secondary | ICD-10-CM | POA: Diagnosis not present

## 2023-04-24 DIAGNOSIS — H35371 Puckering of macula, right eye: Secondary | ICD-10-CM | POA: Diagnosis not present

## 2023-04-24 DIAGNOSIS — H43813 Vitreous degeneration, bilateral: Secondary | ICD-10-CM | POA: Diagnosis not present

## 2023-04-24 DIAGNOSIS — H353114 Nonexudative age-related macular degeneration, right eye, advanced atrophic with subfoveal involvement: Secondary | ICD-10-CM | POA: Diagnosis not present

## 2023-04-24 DIAGNOSIS — H353221 Exudative age-related macular degeneration, left eye, with active choroidal neovascularization: Secondary | ICD-10-CM | POA: Diagnosis not present

## 2023-04-24 DIAGNOSIS — H34232 Retinal artery branch occlusion, left eye: Secondary | ICD-10-CM | POA: Diagnosis not present

## 2023-04-27 ENCOUNTER — Other Ambulatory Visit (HOSPITAL_COMMUNITY): Payer: Self-pay

## 2023-04-30 ENCOUNTER — Other Ambulatory Visit: Payer: Self-pay

## 2023-04-30 ENCOUNTER — Other Ambulatory Visit (HOSPITAL_COMMUNITY): Payer: Self-pay

## 2023-05-31 ENCOUNTER — Other Ambulatory Visit (HOSPITAL_COMMUNITY): Payer: Self-pay

## 2023-06-03 ENCOUNTER — Other Ambulatory Visit (HOSPITAL_COMMUNITY): Payer: Self-pay

## 2023-06-03 DIAGNOSIS — R809 Proteinuria, unspecified: Secondary | ICD-10-CM | POA: Diagnosis not present

## 2023-06-03 DIAGNOSIS — E1165 Type 2 diabetes mellitus with hyperglycemia: Secondary | ICD-10-CM | POA: Diagnosis not present

## 2023-06-03 DIAGNOSIS — I4891 Unspecified atrial fibrillation: Secondary | ICD-10-CM | POA: Diagnosis not present

## 2023-06-03 DIAGNOSIS — E1129 Type 2 diabetes mellitus with other diabetic kidney complication: Secondary | ICD-10-CM | POA: Diagnosis not present

## 2023-06-03 DIAGNOSIS — E785 Hyperlipidemia, unspecified: Secondary | ICD-10-CM | POA: Diagnosis not present

## 2023-06-03 DIAGNOSIS — I10 Essential (primary) hypertension: Secondary | ICD-10-CM | POA: Diagnosis not present

## 2023-06-03 DIAGNOSIS — D6869 Other thrombophilia: Secondary | ICD-10-CM | POA: Diagnosis not present

## 2023-06-04 ENCOUNTER — Other Ambulatory Visit (HOSPITAL_BASED_OUTPATIENT_CLINIC_OR_DEPARTMENT_OTHER): Payer: Self-pay

## 2023-06-04 ENCOUNTER — Other Ambulatory Visit: Payer: Self-pay

## 2023-06-04 ENCOUNTER — Other Ambulatory Visit (HOSPITAL_COMMUNITY): Payer: Self-pay

## 2023-06-04 MED ORDER — METFORMIN HCL ER 500 MG PO TB24
ORAL_TABLET | ORAL | 3 refills | Status: AC
  Filled 2023-06-04: qty 360, 90d supply, fill #0
  Filled 2023-09-09: qty 360, 90d supply, fill #1

## 2023-06-27 ENCOUNTER — Other Ambulatory Visit (HOSPITAL_COMMUNITY): Payer: Self-pay

## 2023-06-27 ENCOUNTER — Other Ambulatory Visit: Payer: Self-pay

## 2023-06-28 ENCOUNTER — Other Ambulatory Visit (HOSPITAL_COMMUNITY): Payer: Self-pay

## 2023-06-28 MED ORDER — TRAMADOL HCL 50 MG PO TABS
50.0000 mg | ORAL_TABLET | Freq: Four times a day (QID) | ORAL | 0 refills | Status: AC
  Filled 2023-06-28: qty 28, 7d supply, fill #0
  Filled 2023-10-08: qty 92, 23d supply, fill #1

## 2023-07-05 ENCOUNTER — Other Ambulatory Visit: Payer: Self-pay

## 2023-08-02 ENCOUNTER — Other Ambulatory Visit (HOSPITAL_COMMUNITY): Payer: Self-pay

## 2023-08-13 ENCOUNTER — Other Ambulatory Visit (HOSPITAL_COMMUNITY): Payer: Self-pay

## 2023-09-09 ENCOUNTER — Other Ambulatory Visit (HOSPITAL_COMMUNITY): Payer: Self-pay

## 2023-09-09 ENCOUNTER — Other Ambulatory Visit (HOSPITAL_BASED_OUTPATIENT_CLINIC_OR_DEPARTMENT_OTHER): Payer: Self-pay

## 2023-10-08 ENCOUNTER — Other Ambulatory Visit: Payer: Self-pay

## 2023-11-06 ENCOUNTER — Other Ambulatory Visit (HOSPITAL_COMMUNITY): Payer: Self-pay

## 2023-11-06 ENCOUNTER — Other Ambulatory Visit: Payer: Self-pay

## 2023-11-08 ENCOUNTER — Other Ambulatory Visit (HOSPITAL_COMMUNITY): Payer: Self-pay

## 2023-11-26 DIAGNOSIS — H353113 Nonexudative age-related macular degeneration, right eye, advanced atrophic without subfoveal involvement: Secondary | ICD-10-CM | POA: Diagnosis not present

## 2023-11-26 DIAGNOSIS — H34232 Retinal artery branch occlusion, left eye: Secondary | ICD-10-CM | POA: Diagnosis not present

## 2023-11-26 DIAGNOSIS — H353221 Exudative age-related macular degeneration, left eye, with active choroidal neovascularization: Secondary | ICD-10-CM | POA: Diagnosis not present

## 2023-11-26 DIAGNOSIS — H43813 Vitreous degeneration, bilateral: Secondary | ICD-10-CM | POA: Diagnosis not present

## 2023-11-26 DIAGNOSIS — H35371 Puckering of macula, right eye: Secondary | ICD-10-CM | POA: Diagnosis not present

## 2023-11-26 DIAGNOSIS — H43823 Vitreomacular adhesion, bilateral: Secondary | ICD-10-CM | POA: Diagnosis not present

## 2023-12-05 ENCOUNTER — Other Ambulatory Visit (HOSPITAL_COMMUNITY): Payer: Self-pay

## 2023-12-17 ENCOUNTER — Other Ambulatory Visit: Payer: Self-pay

## 2023-12-17 ENCOUNTER — Other Ambulatory Visit (HOSPITAL_COMMUNITY): Payer: Self-pay

## 2023-12-17 DIAGNOSIS — Z6827 Body mass index (BMI) 27.0-27.9, adult: Secondary | ICD-10-CM | POA: Diagnosis not present

## 2023-12-17 DIAGNOSIS — E785 Hyperlipidemia, unspecified: Secondary | ICD-10-CM | POA: Diagnosis not present

## 2023-12-17 DIAGNOSIS — M19042 Primary osteoarthritis, left hand: Secondary | ICD-10-CM | POA: Diagnosis not present

## 2023-12-17 DIAGNOSIS — D6869 Other thrombophilia: Secondary | ICD-10-CM | POA: Diagnosis not present

## 2023-12-17 DIAGNOSIS — I119 Hypertensive heart disease without heart failure: Secondary | ICD-10-CM | POA: Diagnosis not present

## 2023-12-17 DIAGNOSIS — E1169 Type 2 diabetes mellitus with other specified complication: Secondary | ICD-10-CM | POA: Diagnosis not present

## 2023-12-17 DIAGNOSIS — G4733 Obstructive sleep apnea (adult) (pediatric): Secondary | ICD-10-CM | POA: Diagnosis not present

## 2023-12-17 DIAGNOSIS — I10 Essential (primary) hypertension: Secondary | ICD-10-CM | POA: Diagnosis not present

## 2023-12-17 DIAGNOSIS — M19041 Primary osteoarthritis, right hand: Secondary | ICD-10-CM | POA: Diagnosis not present

## 2023-12-17 DIAGNOSIS — Z1331 Encounter for screening for depression: Secondary | ICD-10-CM | POA: Diagnosis not present

## 2023-12-17 DIAGNOSIS — I4891 Unspecified atrial fibrillation: Secondary | ICD-10-CM | POA: Diagnosis not present

## 2023-12-17 DIAGNOSIS — Z0000 Encounter for general adult medical examination without abnormal findings: Secondary | ICD-10-CM | POA: Diagnosis not present

## 2023-12-17 MED ORDER — LOSARTAN POTASSIUM-HCTZ 100-12.5 MG PO TABS
1.0000 | ORAL_TABLET | Freq: Every day | ORAL | 3 refills | Status: AC
  Filled 2023-12-17 – 2024-02-14 (×2): qty 90, 90d supply, fill #0
  Filled 2024-06-24: qty 90, 90d supply, fill #1

## 2023-12-17 MED ORDER — FUROSEMIDE 40 MG PO TABS
40.0000 mg | ORAL_TABLET | Freq: Every day | ORAL | 3 refills | Status: AC
  Filled 2023-12-17: qty 90, 90d supply, fill #0
  Filled 2024-02-14 – 2024-03-28 (×2): qty 90, 90d supply, fill #1

## 2023-12-17 MED ORDER — ROSUVASTATIN CALCIUM 10 MG PO TABS
10.0000 mg | ORAL_TABLET | ORAL | 3 refills | Status: AC
  Filled 2023-12-17: qty 14, 98d supply, fill #0

## 2023-12-17 MED ORDER — XARELTO 20 MG PO TABS
20.0000 mg | ORAL_TABLET | Freq: Every day | ORAL | 3 refills | Status: AC
  Filled 2023-12-17 – 2024-02-14 (×2): qty 90, 90d supply, fill #0

## 2023-12-17 MED ORDER — DILTIAZEM HCL ER COATED BEADS 120 MG PO CP24
120.0000 mg | ORAL_CAPSULE | Freq: Every day | ORAL | 3 refills | Status: AC
  Filled 2024-02-14: qty 90, 90d supply, fill #0
  Filled 2024-05-29 – 2024-06-02 (×2): qty 90, 90d supply, fill #1

## 2023-12-17 MED ORDER — METFORMIN HCL ER 500 MG PO TB24
1000.0000 mg | ORAL_TABLET | Freq: Two times a day (BID) | ORAL | 3 refills | Status: AC
  Filled 2023-12-17: qty 360, 90d supply, fill #0

## 2023-12-17 MED ORDER — TRAMADOL HCL 50 MG PO TABS
50.0000 mg | ORAL_TABLET | Freq: Four times a day (QID) | ORAL | 0 refills | Status: AC | PRN
  Filled 2023-12-17: qty 120, 30d supply, fill #0

## 2024-01-28 DIAGNOSIS — H353221 Exudative age-related macular degeneration, left eye, with active choroidal neovascularization: Secondary | ICD-10-CM | POA: Diagnosis not present

## 2024-01-28 DIAGNOSIS — H43813 Vitreous degeneration, bilateral: Secondary | ICD-10-CM | POA: Diagnosis not present

## 2024-01-28 DIAGNOSIS — H43823 Vitreomacular adhesion, bilateral: Secondary | ICD-10-CM | POA: Diagnosis not present

## 2024-01-28 DIAGNOSIS — H34232 Retinal artery branch occlusion, left eye: Secondary | ICD-10-CM | POA: Diagnosis not present

## 2024-01-28 DIAGNOSIS — H35371 Puckering of macula, right eye: Secondary | ICD-10-CM | POA: Diagnosis not present

## 2024-01-28 DIAGNOSIS — H353113 Nonexudative age-related macular degeneration, right eye, advanced atrophic without subfoveal involvement: Secondary | ICD-10-CM | POA: Diagnosis not present

## 2024-02-04 ENCOUNTER — Other Ambulatory Visit (HOSPITAL_COMMUNITY): Payer: Self-pay

## 2024-02-06 ENCOUNTER — Other Ambulatory Visit (HOSPITAL_COMMUNITY): Payer: Self-pay

## 2024-02-07 ENCOUNTER — Other Ambulatory Visit (HOSPITAL_COMMUNITY): Payer: Self-pay

## 2024-02-07 MED ORDER — METOPROLOL TARTRATE 25 MG PO TABS
25.0000 mg | ORAL_TABLET | Freq: Two times a day (BID) | ORAL | 2 refills | Status: AC
  Filled 2024-02-07: qty 180, 90d supply, fill #0
  Filled 2024-05-13: qty 180, 90d supply, fill #1

## 2024-02-14 ENCOUNTER — Other Ambulatory Visit (HOSPITAL_COMMUNITY): Payer: Self-pay

## 2024-02-14 ENCOUNTER — Other Ambulatory Visit: Payer: Self-pay

## 2024-02-17 ENCOUNTER — Other Ambulatory Visit (HOSPITAL_COMMUNITY): Payer: Self-pay

## 2024-03-28 ENCOUNTER — Other Ambulatory Visit (HOSPITAL_COMMUNITY): Payer: Self-pay

## 2024-03-31 ENCOUNTER — Other Ambulatory Visit (HOSPITAL_COMMUNITY): Payer: Self-pay

## 2024-03-31 ENCOUNTER — Other Ambulatory Visit: Payer: Self-pay

## 2024-03-31 MED ORDER — TRAMADOL HCL 50 MG PO TABS
50.0000 mg | ORAL_TABLET | Freq: Four times a day (QID) | ORAL | 0 refills | Status: AC
  Filled 2024-03-31: qty 120, 30d supply, fill #0

## 2024-04-02 ENCOUNTER — Other Ambulatory Visit (HOSPITAL_COMMUNITY): Payer: Self-pay

## 2024-04-02 DIAGNOSIS — R011 Cardiac murmur, unspecified: Secondary | ICD-10-CM | POA: Diagnosis not present

## 2024-04-02 DIAGNOSIS — Z8679 Personal history of other diseases of the circulatory system: Secondary | ICD-10-CM | POA: Diagnosis not present

## 2024-04-02 DIAGNOSIS — E119 Type 2 diabetes mellitus without complications: Secondary | ICD-10-CM | POA: Diagnosis not present

## 2024-04-02 DIAGNOSIS — Z6828 Body mass index (BMI) 28.0-28.9, adult: Secondary | ICD-10-CM | POA: Diagnosis not present

## 2024-04-02 DIAGNOSIS — R5383 Other fatigue: Secondary | ICD-10-CM | POA: Diagnosis not present

## 2024-04-02 DIAGNOSIS — I10 Essential (primary) hypertension: Secondary | ICD-10-CM | POA: Diagnosis not present

## 2024-04-03 DIAGNOSIS — N281 Cyst of kidney, acquired: Secondary | ICD-10-CM | POA: Diagnosis not present

## 2024-04-03 DIAGNOSIS — E119 Type 2 diabetes mellitus without complications: Secondary | ICD-10-CM | POA: Diagnosis not present

## 2024-04-03 DIAGNOSIS — I4891 Unspecified atrial fibrillation: Secondary | ICD-10-CM | POA: Diagnosis not present

## 2024-04-03 DIAGNOSIS — G4733 Obstructive sleep apnea (adult) (pediatric): Secondary | ICD-10-CM | POA: Diagnosis not present

## 2024-04-03 DIAGNOSIS — T45515A Adverse effect of anticoagulants, initial encounter: Secondary | ICD-10-CM | POA: Diagnosis not present

## 2024-04-03 DIAGNOSIS — H3530 Unspecified macular degeneration: Secondary | ICD-10-CM | POA: Diagnosis not present

## 2024-04-03 DIAGNOSIS — R9431 Abnormal electrocardiogram [ECG] [EKG]: Secondary | ICD-10-CM | POA: Diagnosis not present

## 2024-04-03 DIAGNOSIS — Z96653 Presence of artificial knee joint, bilateral: Secondary | ICD-10-CM | POA: Diagnosis not present

## 2024-04-03 DIAGNOSIS — D649 Anemia, unspecified: Secondary | ICD-10-CM | POA: Diagnosis not present

## 2024-04-03 DIAGNOSIS — K3189 Other diseases of stomach and duodenum: Secondary | ICD-10-CM | POA: Diagnosis not present

## 2024-04-03 DIAGNOSIS — Z9889 Other specified postprocedural states: Secondary | ICD-10-CM | POA: Diagnosis not present

## 2024-04-03 DIAGNOSIS — K449 Diaphragmatic hernia without obstruction or gangrene: Secondary | ICD-10-CM | POA: Diagnosis not present

## 2024-04-03 DIAGNOSIS — K921 Melena: Secondary | ICD-10-CM | POA: Diagnosis not present

## 2024-04-03 DIAGNOSIS — Z90710 Acquired absence of both cervix and uterus: Secondary | ICD-10-CM | POA: Diagnosis not present

## 2024-04-03 DIAGNOSIS — R54 Age-related physical debility: Secondary | ICD-10-CM | POA: Diagnosis not present

## 2024-04-03 DIAGNOSIS — E860 Dehydration: Secondary | ICD-10-CM | POA: Diagnosis not present

## 2024-04-03 DIAGNOSIS — Z7984 Long term (current) use of oral hypoglycemic drugs: Secondary | ICD-10-CM | POA: Diagnosis not present

## 2024-04-03 DIAGNOSIS — K5521 Angiodysplasia of colon with hemorrhage: Secondary | ICD-10-CM | POA: Diagnosis not present

## 2024-04-03 DIAGNOSIS — K635 Polyp of colon: Secondary | ICD-10-CM | POA: Diagnosis not present

## 2024-04-03 DIAGNOSIS — K5731 Diverticulosis of large intestine without perforation or abscess with bleeding: Secondary | ICD-10-CM | POA: Diagnosis not present

## 2024-04-03 DIAGNOSIS — D509 Iron deficiency anemia, unspecified: Secondary | ICD-10-CM | POA: Diagnosis not present

## 2024-04-03 DIAGNOSIS — Z7901 Long term (current) use of anticoagulants: Secondary | ICD-10-CM | POA: Diagnosis not present

## 2024-04-03 DIAGNOSIS — Z79899 Other long term (current) drug therapy: Secondary | ICD-10-CM | POA: Diagnosis not present

## 2024-04-03 DIAGNOSIS — Z7952 Long term (current) use of systemic steroids: Secondary | ICD-10-CM | POA: Diagnosis not present

## 2024-04-03 DIAGNOSIS — I4510 Unspecified right bundle-branch block: Secondary | ICD-10-CM | POA: Diagnosis not present

## 2024-04-03 DIAGNOSIS — K760 Fatty (change of) liver, not elsewhere classified: Secondary | ICD-10-CM | POA: Diagnosis not present

## 2024-04-03 DIAGNOSIS — D62 Acute posthemorrhagic anemia: Secondary | ICD-10-CM | POA: Diagnosis not present

## 2024-04-03 DIAGNOSIS — D6832 Hemorrhagic disorder due to extrinsic circulating anticoagulants: Secondary | ICD-10-CM | POA: Diagnosis not present

## 2024-04-03 DIAGNOSIS — K922 Gastrointestinal hemorrhage, unspecified: Secondary | ICD-10-CM | POA: Diagnosis not present

## 2024-04-03 DIAGNOSIS — K5730 Diverticulosis of large intestine without perforation or abscess without bleeding: Secondary | ICD-10-CM | POA: Diagnosis not present

## 2024-04-03 DIAGNOSIS — R911 Solitary pulmonary nodule: Secondary | ICD-10-CM | POA: Diagnosis not present

## 2024-04-03 DIAGNOSIS — Z880 Allergy status to penicillin: Secondary | ICD-10-CM | POA: Diagnosis not present

## 2024-04-03 DIAGNOSIS — K5520 Angiodysplasia of colon without hemorrhage: Secondary | ICD-10-CM | POA: Diagnosis not present

## 2024-04-03 DIAGNOSIS — I10 Essential (primary) hypertension: Secondary | ICD-10-CM | POA: Diagnosis not present

## 2024-04-16 DIAGNOSIS — K922 Gastrointestinal hemorrhage, unspecified: Secondary | ICD-10-CM | POA: Diagnosis not present

## 2024-04-16 DIAGNOSIS — R799 Abnormal finding of blood chemistry, unspecified: Secondary | ICD-10-CM | POA: Diagnosis not present

## 2024-04-16 DIAGNOSIS — Z789 Other specified health status: Secondary | ICD-10-CM | POA: Diagnosis not present

## 2024-04-16 DIAGNOSIS — D649 Anemia, unspecified: Secondary | ICD-10-CM | POA: Diagnosis not present

## 2024-04-16 DIAGNOSIS — Z6829 Body mass index (BMI) 29.0-29.9, adult: Secondary | ICD-10-CM | POA: Diagnosis not present

## 2024-04-23 DIAGNOSIS — D649 Anemia, unspecified: Secondary | ICD-10-CM | POA: Diagnosis not present

## 2024-04-23 DIAGNOSIS — I10 Essential (primary) hypertension: Secondary | ICD-10-CM | POA: Diagnosis not present

## 2024-04-23 DIAGNOSIS — Z6828 Body mass index (BMI) 28.0-28.9, adult: Secondary | ICD-10-CM | POA: Diagnosis not present

## 2024-04-24 DIAGNOSIS — D500 Iron deficiency anemia secondary to blood loss (chronic): Secondary | ICD-10-CM | POA: Diagnosis not present

## 2024-05-09 ENCOUNTER — Other Ambulatory Visit (HOSPITAL_COMMUNITY): Payer: Self-pay

## 2024-05-13 ENCOUNTER — Other Ambulatory Visit (HOSPITAL_COMMUNITY): Payer: Self-pay

## 2024-05-29 ENCOUNTER — Other Ambulatory Visit (HOSPITAL_COMMUNITY): Payer: Self-pay

## 2024-06-02 ENCOUNTER — Other Ambulatory Visit (HOSPITAL_COMMUNITY): Payer: Self-pay

## 2024-06-05 ENCOUNTER — Other Ambulatory Visit (HOSPITAL_COMMUNITY): Payer: Self-pay

## 2024-06-11 ENCOUNTER — Ambulatory Visit: Attending: Cardiovascular Disease | Admitting: Internal Medicine

## 2024-06-11 ENCOUNTER — Encounter: Payer: Self-pay | Admitting: Internal Medicine

## 2024-06-11 VITALS — BP 128/78 | HR 72 | Ht 64.0 in | Wt 173.4 lb

## 2024-06-11 DIAGNOSIS — Z7901 Long term (current) use of anticoagulants: Secondary | ICD-10-CM | POA: Diagnosis not present

## 2024-06-11 DIAGNOSIS — Z7689 Persons encountering health services in other specified circumstances: Secondary | ICD-10-CM | POA: Diagnosis not present

## 2024-06-11 DIAGNOSIS — I4891 Unspecified atrial fibrillation: Secondary | ICD-10-CM | POA: Diagnosis not present

## 2024-06-11 DIAGNOSIS — R011 Cardiac murmur, unspecified: Secondary | ICD-10-CM | POA: Diagnosis not present

## 2024-06-11 DIAGNOSIS — I482 Chronic atrial fibrillation, unspecified: Secondary | ICD-10-CM | POA: Diagnosis not present

## 2024-06-11 NOTE — Patient Instructions (Signed)
 Medication Instructions:  Not needed  *If you need a refill on your cardiac medications before your next appointment, please call your pharmacy*   Lab Work: No needed.   Testing/Procedures: 1) Your physician has requested that you have an echocardiogram. Echocardiography is a painless test that uses sound waves to create images of your heart. It provides your doctor with information about the size and shape of your heart and how well your hearts chambers and valves are working. This procedure takes approximately one hour. There are no restrictions for this procedure. Please do NOT wear cologne, perfume, aftershave, or lotions (deodorant is allowed). Please arrive 15 minutes prior to your appointment time.  Please note: We ask at that you not bring children with you during ultrasound (echo/ vascular) testing. Due to room size and safety concerns, children are not allowed in the ultrasound rooms during exams. Our front office staff cannot provide observation of children in our lobby area while testing is being conducted. An adult accompanying a patient to their appointment will only be allowed in the ultrasound room at the discretion of the ultrasound technician under special circumstances. We apologize for any inconvenience.    Follow-Up: At [facility], you and your health needs are our priority.  As part of our continuing mission to provide you with exceptional heart care, we have created designated Provider Care Teams.  These Care Teams include your primary Cardiologist (physician) and Advanced Practice Providers (APPs -  Physician Assistants and Nurse Practitioners) who all work together to provide you with the care you need, when you need it.     Your next appointment:   6 month(s)  The format for your next appointment:   In Person  Provider:   Emeline FORBES Calender, DO

## 2024-06-11 NOTE — Progress Notes (Signed)
 " Cardiology Office Note:  .   Date:  06/11/2024  ID:  [name], DOB [birth year], MRN [id number] PCP: Shelia Mliss KATHEE, PA  [facility] Providers Cardiologist:  Emeline FORBES Calender, DO    History of Present Illness: .    [name] is a 84 y.o. female With a past medical history of hypertension, OSA on CPAP, atrial fibrillation on Xarelto , prediabetes, iron deficiency anemia who presents today for follow-up  Discussed the use of AI scribe software for clinical note transcription with the patient, who gave verbal consent to proceed.  History of Present Illness [name] is an 84 year old female with atrial fibrillation and iron deficiency anemia who presents for follow-up. She is accompanied by Shona. She was seen by a GI specialist for iron deficiency anemia and recommended to undergo video capsule endoscopy.  She has a 20-year history of atrial fibrillation, which worsened during a period of blood loss. She reports having atrial fibrillation for 20 years and has reportedly undergone cardioversion three times, with the arrhythmia returning within two weeks each time. She was recently switched from Xarelto  to Eliquis due to concerns about gastrointestinal bleeding risk.  Her iron deficiency anemia has led to hemoglobin levels dropping necessitating multiple blood transfusion, which improved her symptoms. She experiences weakness in her legs and difficulty regaining strength.  She reports her hemoglobin was 11.0 as of last Friday though records are unavailable. 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   She experiences shortness of breath on exertion, which worsened during the summer when she experienced blood loss. She received two units of blood, which helped alleviate the symptoms. No chest pain but notes swelling in one ankle, which has been persistent and unchanged over time. She takes Lasix  40 mg,  which she reports works well for her.  She has a history of hepatic steatosis and is recommended to undergo a video capsule endoscopy to evaluate for potential sources of gastrointestinal bleeding.      ROS: Remaining review of systems negative  Studies Reviewed: SABRA   EKG Interpretation Date/Time:  Thursday June 11 2024 08:14:19 EST Ventricular Rate:  72 PR Interval:    QRS Duration:  144 QT Interval:  440 QTC Calculation: 481 R Axis:   51  Text Interpretation: Atrial fibrillation Right bundle branch block Septal infarct (cited on or before 04-May-2011) T wave abnormality, consider inferior ischemia When compared with ECG of 18-Sep-2011 12:08, Right bundle branch block is now Present Confirmed by Calender Emeline [phone]) on 06/11/2024 8:17:41 AM    Results Diagnostic EGD (04/2024): Normal Colonoscopy (04/2024): Two small benign polyps and two small arteriovenous malformations ablated Risk Assessment/Calculations:    CHA2DS2-VASc Score =     This indicates a  % annual risk of stroke. The patient's score is based upon:              Physical Exam:   VS:  BP 128/78 (BP Location: Right Arm, Patient Position: Sitting, Cuff Size: Normal)   Pulse 72   Ht 5' 4 (1.626 m)   Wt 173 lb 6.4 oz (78.7 kg)   SpO2 95%   BMI 29.76 kg/m    Wt Readings from Last 3 Encounters:  06/11/24 173 lb 6.4 oz (78.7 kg)  03/11/13 203 lb (92.1 kg)  12/22/12 210 lb (95.3 kg)    GEN: Well nourished, well developed in no acute distress NECK:  No JVD; No carotid bruits CARDIAC: Irregular rhythm, 3/6 systolic murmurs, no rubs, no gallops RESPIRATORY:  Clear to auscultation without rales, wheezing or rhonchi  ABDOMEN: Soft, non-tender, non-distended EXTREMITIES:  No edema; No deformity   ASSESSMENT AND PLAN: .    Assessment and Plan Assessment & Plan Permanent atrial fibrillation, stable 20 years, likely permanent. Managed with anticoagulation.  Previously on Xarelto  and changed to Eliquis by PCP  due to concern for GI bleed - Continue Eliquis for anticoagulation.  Systolic heart murmur Suspected aortic stenosis based on exam. Echocardiogram to assess severity.  - Echo ordered  Iron deficiency anemia Recent hemoglobin at 11.0, previously 6.0. AVMs ablated. Video capsule endoscopy scheduled for small intestine evaluation. - Obtain recent lab results from primary care provider. - Proceed with video capsule endoscopy to evaluate small intestine with GI.  Long-term anticoagulant use  Dyspnea on exertion - Echocardiogram ordered  Hypertension Managed with diltiazem , Lasix , losartan -HCTZ, and metoprolol  tartrate.            Follow up: 6 months  Signed, Emeline FORBES Calender, DO  06/11/2024 8:47 AM    [facility] "

## 2024-06-24 ENCOUNTER — Other Ambulatory Visit (HOSPITAL_COMMUNITY): Payer: Self-pay

## 2024-07-14 ENCOUNTER — Ambulatory Visit (HOSPITAL_COMMUNITY)
